# Patient Record
Sex: Male | Born: 1940 | Race: White | Hispanic: No | Marital: Married
Health system: Midwestern US, Academic
[De-identification: ages and names within clinical notes are randomized; demographics above are authoritative.]

---

## 2018-11-30 ENCOUNTER — Encounter: Admit: 2018-11-30 | Discharge: 2018-11-30

## 2018-12-01 ENCOUNTER — Encounter: Admit: 2018-12-01 | Discharge: 2018-12-01

## 2018-12-03 NOTE — Telephone Encounter
12/03/18 sent 2nd STAT request for records to [facility] edh

## 2018-12-04 ENCOUNTER — Encounter: Admit: 2018-12-04 | Discharge: 2018-12-04

## 2018-12-04 NOTE — Telephone Encounter
On 12/04/18 Medical records received records from [facility]. They can be accessed through the [facility] for the 12/10/18 appt with Dr Artis Delay.    *Consult and Holter Graph and Report are still pending as of 12/04/18, Kathe Mariner will send once available.*

## 2018-12-05 ENCOUNTER — Encounter: Admit: 2018-12-05 | Discharge: 2018-12-05

## 2018-12-10 ENCOUNTER — Encounter: Admit: 2018-12-10 | Discharge: 2018-12-10

## 2018-12-10 DIAGNOSIS — I10 Essential (primary) hypertension: Secondary | ICD-10-CM

## 2018-12-10 DIAGNOSIS — E7849 Other hyperlipidemia: Secondary | ICD-10-CM

## 2018-12-10 DIAGNOSIS — E785 Hyperlipidemia, unspecified: Secondary | ICD-10-CM

## 2018-12-10 DIAGNOSIS — I493 Ventricular premature depolarization: Principal | ICD-10-CM

## 2018-12-10 NOTE — Progress Notes
[facility]- Cardiovascular Medicine     CARDIOLOGY CLINIC NEW PATIENT ENCOUNTER    REFERRING PHYSICIAN:  Einar Pheasant, DO  [address]  [address]  [place], [place] [address]  Phone: [phone]    PCP:  Erskine Emery  [address]  [place] [address]  [phone]    PATIENT ID: Bobby Duarte is a 78 y.o. male    ENCOUNTER DATE: 12/10/2018    CHIEF COMPLAINT(S): New Patient (Referred by [facility]) and Cardiac Eval (Ventricular premature depolarization)         HISTORY OF THE PRESENT ILLNESS:    Bobby Duarte is a pleasant 78 y.o. male who is being seen in the Cardiology clinic today for the above chief complaint(s). His cardiac history is remarkable for HTN, HLD.  BP controlled.  He was evaluated recently in the hospital for not feeling himself and spacing out.  He had some PVCs on the monitor therefore he was kept for observation.  He underwent an exercise stress echocardiogram that was unremarkable.  He underwent a 24-hour Holter monitor that showed PVCs.    He denies: CP, SOB, orthopnea, DOE, LE swelling, Syncope, Palpitations, N/v/D.  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  No smoking         History reviewed. No pertinent past medical history.  History reviewed. No pertinent surgical history.  Family History   Problem Relation Age of Onset   ??? Cancer Mother    ??? Hypertension Father      Social History     Socioeconomic History   ??? Marital status: Unknown     Spouse name: Not on file   ??? Number of children: Not on file   ??? Years of education: Not on file   ??? Highest education level: Not on file   Occupational History   ??? Not on file   Tobacco Use   ??? Smoking status: Never Smoker   ??? Smokeless tobacco: Never Used   Substance and Sexual Activity   ??? Alcohol use: Yes     Comment: occ   ??? Drug use: Never   ??? Sexual activity: Not on file   Other Topics Concern   ??? Not on file   Social History Narrative   ??? Not on file Allergies:  No Known Allergies      Medications:    Current Outpatient Medications:   ???  allopurinoL (ZYLOPRIM) 100 mg tablet, Take 100 mg by mouth daily., Disp: , Rfl:   ???  hydroCHLOROthiazide (HYDRODIURIL) 25 mg tablet, Take 1 tablet by mouth daily., Disp: , Rfl:   ???  lisinopriL (ZESTRIL) 40 mg tablet, Take 1 tablet by mouth at bedtime daily., Disp: , Rfl:   ???  NIFEdipine XL (PROCARDIA-XL) 60 mg tablet, Take 1 tablet by mouth at bedtime daily., Disp: , Rfl:   ???  rosuvastatin (CRESTOR) 10 mg tablet, Take 1 tablet by mouth daily., Disp: , Rfl:     Review of Symptoms:  Constitutional: negative  Eyes: negative  Ears, nose, mouth, throat, and face: negative  Respiratory: negative  Cardiovascular: negative  Gastrointestinal: negative  Hematologic/lymphatic: negative  Musculoskeletal:negative  Neurological: negative  Behavioral/Psych: negative  Endocrine: negative  Allergic/Immunologic: negative    Physical Exam:  BP 132/78 (BP Source: Arm, Right Upper)  - Pulse 65  - Ht 1.829 m (6')  - Wt 96.9 kg (213 lb 9.6 oz)  -  BMI 28.97 kg/m???   General Appearance:    Alert, cooperative, no distress, appears stated age  Head:    Normocephalic, without obvious abnormality, atraumatic  Eyes:    PERRL, conjunctiva/corneas clear  Throat:   Lips, mucosa, and tongue normal  Neck:   no carotid bruit or JVD  Back:     Symmetric, no curvature  Lungs:     Clear to auscultation bilaterally, respirations unlabored  Chest Wall:    No tenderness or deformity   Heart:    Regular rate and rhythm, S1 and S2 normal, no murmur, rub or gallop  Abdomen:     Soft, non-tender, bowel sounds active all four quadrants, no masses, no organomegaly  Extremities:   No edema  Pulses:   2+ and symmetric all extremities  Skin:   Skin color, texture, turgor normal, no rashes or lesions  Neurologic:   Non focal        Lab/Radiology/Other Diagnostic Tests:  `Hemogram   Lab Results   Component Value Date/Time    WBC 8.5 11/30/2018    RBC 5.30 11/30/2018 HGB 16.0 11/30/2018    HCT 47.8 11/30/2018    MCV 90.2 11/30/2018    MCH 30.1 11/30/2018    MCHC 33.4 11/30/2018    RDW 12.7 11/30/2018    PLTCT 246 11/30/2018        Lab Level  Lab Results   Component Value Date    CHOL 151 04/02/2018     Lab Results   Component Value Date    LDL 86 04/02/2018     No results found for: LIPOPROTA  Lab Results   Component Value Date    HDL 54 04/02/2018     No results found for: [facility]  Lab Results   Component Value Date    TRIG 128 04/02/2018       Basic Chemistry    Lab Results   Component Value Date    NA 140 11/30/2018     Lab Results   Component Value Date    K 3.8 11/30/2018     Lab Results   Component Value Date    CO2 24.0 11/30/2018     Lab Results   Component Value Date    BUN 23.0 11/30/2018     Lab Results   Component Value Date    CR 1.25 11/30/2018     Lab Results   Component Value Date    CL 107 11/30/2018         Exercise stress echocardiogram: 12/08/2018 outside hospital.  Exercised for 6 minutes.  No evidence for ischemia with exercise.  PVCs at rest and with exercise.    Holter monitor 6.9% PVCs.       ASSESSMENT AND PLAN:  1.  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  -We will start low-dose beta-blocker if his symptoms do not improve with stopping his Crestor.  Repeat Holter monitor on metoprolol.   - He might require some neurologic evaluation.  His blood pressure appears to be stable.  He provides me with a log that shows his blood pressure between 130 systolic and 70s to 80s diastolic.  2.  Dyslipidemia.  We will hold off Crestor for now for a week to see if it improves at Crestor can result in mild cognitive impairment.  3.  Hypertension appears controlled.  Control reviewed current medications.    Lucianne Muss, MD  1:25 PM, 12/10/2018

## 2018-12-10 NOTE — Telephone Encounter
On 12/10/18 Medical records received Echo, Holter and Cardiology Consult from [facility]. They can be accessed through the [facility] for the 12/10/18 appt with Dr Artis Delay

## 2018-12-10 NOTE — Patient Instructions
STOP taking Crestor (rosuvastatin)     Please make your 3 month follow up appointment with Dr. Artis Delay before leaving the office today    In order to provide you the best care possible we ask that you follow up as below:    ? For all questions that may need to be addressed urgently, please call a member of the [facility] on Dr. Madaline Brilliant nursing line at [phone] Monday - Friday 8-5 only.         Please leave a detailed message with your name, date of birth, and reason for your call.  A nurse will return your call as soon as possible.     ? For NON-URGENT questions please contact us through your MyChart account.     ? For all medication refills please contact your pharmacy or send a request through [facility].     ? Please allow 10-15 business days for the results of any testing to be reviewed. Please call our office if you have not heard from a nurse within this time frame.    1. To schedule an appointment call [phone]  2. To Schedule a procedure/Test call [phone]  3. To schedule with EP call [phone]     It was truly our pleasure seeing you today. Thank you for choosing [facility] for your heart health!

## 2018-12-11 ENCOUNTER — Ambulatory Visit: Admit: 2018-12-10 | Discharge: 2018-12-11

## 2018-12-19 ENCOUNTER — Encounter: Admit: 2018-12-19 | Discharge: 2018-12-19

## 2018-12-19 DIAGNOSIS — I493 Ventricular premature depolarization: Secondary | ICD-10-CM

## 2018-12-19 MED ORDER — ROSUVASTATIN 10 MG PO TAB
10 mg | ORAL_TABLET | Freq: Every day | ORAL | 3 refills | 90.00000 days | Status: AC
Start: 2018-12-19 — End: ?

## 2018-12-19 MED ORDER — METOPROLOL SUCCINATE 25 MG PO TB24
12.5 mg | ORAL_TABLET | Freq: Every day | ORAL | 3 refills | 90.00000 days | Status: DC
Start: 2018-12-19 — End: 2019-02-27

## 2018-12-19 NOTE — Telephone Encounter
-----   Message from Donetta Potts, RN sent at 12/10/2018  1:57 PM CDT -----  Regarding: clinic follow up  On 12/09/17 at [place], pt stopped Crestor per [facility] d/t mental fogginess. Did stopping the Crestor clear up his thinking/mentation after being off of it for 1 week?    If so, pt is to stay off Crestor and let Dr. Artis Delay know.    If not, pt is to resume Crestor 10 mg daily AND add Toprol XL 12.5 mg daily at bedtime AND 48 hour holter monitor placed 1 month from the start of the Toprol xl (indication PVCs)

## 2018-12-19 NOTE — Telephone Encounter
Pt's wife returned my call and stated she has not see any improvements in his mentation since stopping the Crestor over a week ago. Wife instructed to have patient resume taking Crestor 10 mg daily and start Toprol XL 12.5 mg daily in the evening. Wife also informed that pt will need to schedule a 48 hour Holter monitor after being on the Toprol XL for about a month.

## 2019-02-02 ENCOUNTER — Encounter: Admit: 2019-02-02 | Discharge: 2019-02-02 | Payer: MEDICARE

## 2019-02-02 NOTE — Telephone Encounter
Spouse called stating pt was recently at [facility] for dizziness. Per spouse, pt was given Antivert and medication for vomiting. She said that as pt was hooked up to telemetry while there she was told his PVC's were no better than in July. She wanted to know if pt should increase his metoprolol. I told spouse we would request records from recent hospitalization. We will have Dr Artis Delay review records and evaluate pt at upcoming ov in a couple of weeks. Spouse agreeable to plan. Records requested from [facility] for upcoming appointment.

## 2019-02-11 ENCOUNTER — Encounter: Admit: 2019-02-11 | Discharge: 2019-02-11 | Payer: MEDICARE

## 2019-02-11 NOTE — Progress Notes
Holter Placement Record  Brand: Bio-Tel(CardioNet)  Ordering Physician: Artis Delay   Diagnosis: PVC'S  Length: 72  Serial Number: Holter enrollment   Start Time: home enrollment   Location where Holter was placed: Home Enrollment  Will Holter be returned by mail? Yes

## 2019-02-11 NOTE — Telephone Encounter
Called and spoke with Arlene.   Okay to send address is correct in chart

## 2019-02-27 ENCOUNTER — Ambulatory Visit: Admit: 2019-02-27 | Discharge: 2019-02-28 | Payer: MEDICARE

## 2019-02-27 ENCOUNTER — Encounter: Admit: 2019-02-27 | Discharge: 2019-02-27 | Payer: MEDICARE

## 2019-02-27 NOTE — Progress Notes
[facility]- Cardiovascular Medicine     CARDIOLOGY CLINIC NEW PATIENT ENCOUNTER    REFERRING PHYSICIAN:  Lucianne Muss, MD  [address]  [place], [place] [address]  Phone: [phone]    PCP:  Erskine Emery  [address]  [place] [address]  [phone]    PATIENT ID: Bobby Duarte is a 78 y.o. male    ENCOUNTER DATE: 02/27/2019    CHIEF COMPLAINT(S): Hypertension (review 72 hr monitor results) and PVC's         HISTORY OF THE PRESENT ILLNESS:    Bobby Duarte is a pleasant 78 y.o. male who is being seen in the Cardiology clinic today for the above chief complaint(s). His cardiac history is remarkable for HTN, HLD.  BP controlled.  He was evaluated July 2020 in the hospital for not feeling himself and spacing out.  He had some PVCs on the monitor therefore he was kept for observation.  He underwent an exercise stress echocardiogram that was unremarkable.  He underwent a 24-hour Holter monitor that showed PVCs.  He has been doing well but mid-September he had episodes of vertigo with nausea he went to the hospital where he had an EKG that showed his PVCs his blood pressure was controlled and his heart rate was controlled felt to be more like vertigo so he was started on meclizine his symptoms revolve resolved he did not like the meclizine afterwards and was stopped.  He has been doing well today.  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  No smoking         No past medical history on file.  No past surgical history on file.  Family History   Problem Relation Age of Onset   ? Cancer Mother    ? Hypertension Father      Social History     Socioeconomic History   ? Marital status: Married     Spouse name: Not on file   ? Number of children: Not on file   ? Years of education: Not on file ? Highest education level: Not on file   Occupational History   ? Not on file   Tobacco Use   ? Smoking status: Never Smoker   ? Smokeless tobacco: Never Used   Substance and Sexual Activity   ? Alcohol use: Yes     Comment: occ   ? Drug use: Never   ? Sexual activity: Not on file   Other Topics Concern   ? Not on file   Social History Narrative   ? Not on file        Allergies:  No Known Allergies      Medications:    Current Outpatient Medications:   ?  allopurinoL (ZYLOPRIM) 100 mg tablet, Take 100 mg by mouth daily., Disp: , Rfl:   ?  hydroCHLOROthiazide (HYDRODIURIL) 25 mg tablet, Take 1 tablet by mouth daily., Disp: , Rfl:   ?  lisinopriL (ZESTRIL) 40 mg tablet, Take 1 tablet by mouth at bedtime daily., Disp: , Rfl:   ?  metoprolol XL (TOPROL XL) 25 mg extended release tablet, Take one-half tablet by mouth daily., Disp: 45 tablet, Rfl: 3  ?  NIFEdipine XL (PROCARDIA-XL) 60 mg tablet, Take 1 tablet  by mouth at bedtime daily., Disp: , Rfl:   ?  rosuvastatin (CRESTOR) 10 mg tablet, Take one tablet by mouth daily., Disp: 90 tablet, Rfl: 3    Review of Symptoms:  Constitution: Negative.   HENT: Negative.   Eyes: Negative.   Cardiovascular: Negative.   Respiratory: Negative.   Endocrine: Negative.   Hematologic/Lymphatic: Negative.   Skin: Negative.   Gastrointestinal: Negative.   Genitourinary: Negative.   Neurological: Positive for loss of balance and vertigo.   Psychiatric/Behavioral: Negative.   Allergic/Immunologic: Negative.     Physical Exam:    BP 128/72 (BP Source: Arm, Right Upper)  - Pulse 69  - Ht 1.803 m (5' 11)  - Wt 97.4 kg (214 lb 12.8 oz)  - BMI 29.96 kg/m?     General Appearance:    Alert, cooperative, no distress, appears stated age  Head:    Normocephalic, without obvious abnormality, atraumatic  Eyes:    PERRL, conjunctiva/corneas clear  Throat:   Lips, mucosa, and tongue normal  Neck:   no carotid bruit or JVD  Back:     Symmetric, no curvature Lungs:     Clear to auscultation bilaterally, respirations unlabored  Chest Wall:    No tenderness or deformity   Heart:    Regular rate and rhythm, S1 and S2 normal, no murmur, rub or gallop  Abdomen:     Soft, non-tender, bowel sounds active all four quadrants, no masses, no organomegaly  Extremities:   No edema  Pulses:   2+ and symmetric all extremities  Skin:   Skin color, texture, turgor normal, no rashes or lesions  Neurologic:   Non focal        Lab/Radiology/Other Diagnostic Tests:  `Hemogram   Lab Results   Component Value Date/Time    WBC 8.5 11/30/2018    RBC 5.30 11/30/2018    HGB 16.0 11/30/2018    HCT 47.8 11/30/2018    MCV 90.2 11/30/2018    MCH 30.1 11/30/2018    MCHC 33.4 11/30/2018    RDW 12.7 11/30/2018    PLTCT 246 11/30/2018        Lab Level  Lab Results   Component Value Date    CHOL 151 04/02/2018     Lab Results   Component Value Date    LDL 86 04/02/2018     No results found for: LIPOPROTA  Lab Results   Component Value Date    HDL 54 04/02/2018     No results found for: [facility]  Lab Results   Component Value Date    TRIG 128 04/02/2018       Basic Chemistry    Lab Results   Component Value Date    NA 140 11/30/2018     Lab Results   Component Value Date    K 3.8 11/30/2018     Lab Results   Component Value Date    CO2 24.0 11/30/2018     Lab Results   Component Value Date    BUN 23.0 11/30/2018     Lab Results   Component Value Date    CR 1.25 11/30/2018     Lab Results   Component Value Date    CL 107 11/30/2018         Exercise stress echocardiogram: 12/08/2018 outside hospital.  Exercised for 6 minutes.  No evidence for ischemia with exercise.  PVCs at rest and with exercise.    Holter monitor 6.9% PVCs.  ASSESSMENT AND PLAN:  1.  Positional vertigo, I will refer him to ENT.  I will get carotid ultrasound.  It sounds to be very positional when he rotates in bed and raise his head up.  If negative ENT evaluation I think is reasonable to be evaluated by neurology.  His brainstem functional exam is unremarkable today.    2. PVCs: Appears to be asymptomatic.  Pending Holter result.  If above 10% might add low-dose flecainide.  He had a negative stress echo recently.  -We will cut down on his HCTZ as his last potassium was 3.5  -We will increase his Toprol to 25 mg daily.  3.  Dyslipidemia.  Continue Crestor.  4.  Hypertension appears controlled.  Control reviewed current medications.There will be adjusted with his PCP as needed    Lucianne Muss, MD  9:12 AM, 02/27/2019

## 2019-02-28 DIAGNOSIS — R42 Dizziness and giddiness: Secondary | ICD-10-CM

## 2019-02-28 DIAGNOSIS — I10 Essential (primary) hypertension: Principal | ICD-10-CM

## 2019-03-02 ENCOUNTER — Encounter: Admit: 2019-03-02 | Discharge: 2019-03-02 | Payer: MEDICARE

## 2019-03-05 ENCOUNTER — Encounter: Admit: 2019-03-05 | Discharge: 2019-03-05 | Payer: MEDICARE

## 2019-03-05 NOTE — Telephone Encounter
Attempted to call patient.  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 LM for pt to R/C to discuss symptoms as per rec from ABM.

## 2019-03-05 NOTE — Telephone Encounter
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  Continue current therapies.  We might need to add low-dose flecainide. we have recently increased his metoprolol to 25 mg daily, can you check on him please if he has not felt any difference since increasing the metoprolol he can go back to his normal dose 12.5 mg daily  (as his maximum heart rate on the current monitor was only 99 bpm).

## 2019-03-06 ENCOUNTER — Encounter: Admit: 2019-03-06 | Discharge: 2019-03-06 | Payer: MEDICARE

## 2019-03-06 NOTE — Telephone Encounter
Spoke to pt's spouse. He is feeling fine and not having any symptoms. Will go back to taking metoprolol 12.5 mg. 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  Continue current therapies.  We might need to add low-dose flecainide. we have recently increased his metoprolol to 25 mg daily, can you check on him please if he has not felt any difference since increasing the metoprolol he can go back to his normal dose 12.5 mg daily  (as his maximum heart rate on the current monitor was only 99 bpm).

## 2019-03-09 ENCOUNTER — Encounter: Admit: 2019-03-09 | Discharge: 2019-03-09 | Payer: MEDICARE

## 2019-03-09 NOTE — Telephone Encounter
Wife called to clarify that pt was supposed to be on Toprol XL 12.5 mg daily. I confirmed with pt's wife that she was correct on the dose of 12.5 mg daily, wife verbalized understanding of all information given.

## 2019-03-10 ENCOUNTER — Encounter: Admit: 2019-03-10 | Discharge: 2019-03-10 | Payer: MEDICARE

## 2019-03-10 MED ORDER — METOPROLOL SUCCINATE 25 MG PO TB24
25 mg | ORAL_TABLET | Freq: Every day | ORAL | 3 refills | 90.00000 days | Status: AC
Start: 2019-03-10 — End: ?

## 2019-03-10 MED ORDER — METOPROLOL SUCCINATE 25 MG PO TB24
12.5 mg | ORAL_TABLET | Freq: Every day | ORAL | 3 refills | 90.00000 days | Status: DC
Start: 2019-03-10 — End: 2019-03-10

## 2019-03-10 NOTE — Telephone Encounter
Pt was to go back to taking 12.5 mg of metoprolol if not feeling better. Initially sid he did not feel and different. Now thinks he does and wants to stay on 25 mg.

## 2019-08-27 ENCOUNTER — Encounter: Admit: 2019-08-27 | Discharge: 2019-08-27 | Payer: MEDICARE

## 2019-08-27 NOTE — Progress Notes
Trilby Leaver, 04-12-1941 has an appointment with Dr. Raynelle Bring on 09/02/19.    Please send recent lab results for continuity of care.    Thank you,   Bee Marchiano    Phone: [phone]  Fax: [phone]

## 2019-08-31 ENCOUNTER — Encounter: Admit: 2019-08-31 | Discharge: 2019-08-31 | Payer: MEDICARE

## 2019-09-02 ENCOUNTER — Ambulatory Visit: Admit: 2019-09-02 | Discharge: 2019-09-02 | Payer: MEDICARE

## 2019-09-02 ENCOUNTER — Encounter: Admit: 2019-09-02 | Discharge: 2019-09-02 | Payer: MEDICARE

## 2019-09-02 DIAGNOSIS — E785 Hyperlipidemia, unspecified: Secondary | ICD-10-CM

## 2019-09-02 DIAGNOSIS — I493 Ventricular premature depolarization: Secondary | ICD-10-CM

## 2019-09-02 DIAGNOSIS — I10 Essential (primary) hypertension: Secondary | ICD-10-CM

## 2019-09-02 DIAGNOSIS — R7303 Prediabetes: Secondary | ICD-10-CM

## 2019-10-16 ENCOUNTER — Encounter: Admit: 2019-10-16 | Discharge: 2019-10-16 | Payer: MEDICARE

## 2019-10-16 DIAGNOSIS — I493 Ventricular premature depolarization: Secondary | ICD-10-CM

## 2019-10-16 DIAGNOSIS — I10 Essential (primary) hypertension: Secondary | ICD-10-CM

## 2019-10-16 DIAGNOSIS — E785 Hyperlipidemia, unspecified: Secondary | ICD-10-CM

## 2019-10-21 ENCOUNTER — Encounter: Admit: 2019-10-21 | Discharge: 2019-10-21 | Payer: MEDICARE

## 2019-10-21 DIAGNOSIS — I10 Essential (primary) hypertension: Secondary | ICD-10-CM

## 2019-10-21 DIAGNOSIS — I493 Ventricular premature depolarization: Secondary | ICD-10-CM

## 2019-10-21 DIAGNOSIS — E785 Hyperlipidemia, unspecified: Secondary | ICD-10-CM

## 2019-10-25 ENCOUNTER — Encounter: Admit: 2019-10-25 | Discharge: 2019-10-25 | Payer: MEDICARE

## 2019-10-25 DIAGNOSIS — I493 Ventricular premature depolarization: Secondary | ICD-10-CM

## 2019-10-25 DIAGNOSIS — E785 Hyperlipidemia, unspecified: Secondary | ICD-10-CM

## 2019-10-25 DIAGNOSIS — I10 Essential (primary) hypertension: Secondary | ICD-10-CM

## 2020-01-15 ENCOUNTER — Encounter: Admit: 2020-01-15 | Discharge: 2020-01-15 | Payer: MEDICARE

## 2020-01-15 NOTE — Telephone Encounter
returned call  Bobby Duarte states she made the appt for the cardiac MRI at [facility] and plans to have his calcium score completed in [place]  she had no further questions at this time

## 2020-01-15 NOTE — Telephone Encounter
-----   Message from Golda Acre, LPN sent at 06/08/1476 10:38 AM CDT -----  Regarding: RAD- test ?  VM on triage line from wife [phone].  Said that RAD wants him to have CT scan done.  Can that be done at [facility]. [place] office or the [place] [facility]?

## 2020-02-05 ENCOUNTER — Ambulatory Visit: Admit: 2020-02-05 | Discharge: 2020-02-05 | Payer: MEDICARE

## 2020-02-05 ENCOUNTER — Encounter: Admit: 2020-02-05 | Discharge: 2020-02-05 | Payer: MEDICARE

## 2020-02-05 DIAGNOSIS — I493 Ventricular premature depolarization: Secondary | ICD-10-CM

## 2020-02-05 MED ORDER — GADOBENATE DIMEGLUMINE 529 MG/ML (0.1MMOL/0.2ML) IV SOLN
40 mL | Freq: Once | INTRAVENOUS | 0 refills | Status: CP
Start: 2020-02-05 — End: ?
  Administered 2020-02-05: 22:00:00 40 mL via INTRAVENOUS

## 2020-02-05 MED ORDER — SODIUM CHLORIDE 0.9 % IJ SOLN
20 mL | Freq: Once | INTRAVENOUS | 0 refills | Status: CP
Start: 2020-02-05 — End: ?
  Administered 2020-02-05: 22:00:00 20 mL via INTRAVENOUS

## 2020-02-09 ENCOUNTER — Encounter: Admit: 2020-02-09 | Discharge: 2020-02-09 | Payer: MEDICARE

## 2020-02-09 NOTE — Progress Notes
Request for the following medical records for purpose of continuity of care:   Has an appointment with RAD on 02/15/2020    Please send results of Calcium Score CT    Please Fax to:  [facility] - [phone]  Dr. Wallene Huh  Attention:  Valentina Gu, RN    Thank you

## 2020-02-09 NOTE — Telephone Encounter
Spoke with patient's wife regarding Dr. Doris Cheadle recommendations. Patient verbalized understanding and agreed with plan of care.

## 2020-02-09 NOTE — Telephone Encounter
-----   Message from Lucianne Muss, MD sent at 02/09/2020  2:37 PM CDT -----  Normal MRI with normal systolic function and no significant infiltrative or inflammatory process.

## 2020-02-11 ENCOUNTER — Encounter: Admit: 2020-02-11 | Discharge: 2020-02-11 | Payer: MEDICARE

## 2020-02-11 NOTE — Progress Notes
Records Request    Medical records request for continuation of care:    Patient has appointment on 02/15/2020   with  Dr. Larwance Sachs* .    Please fax records to Cardiovascular Medicine [place] [facility] [phone]    Request records: STAT          CT Calcium Score                 Thank you,      Cardiovascular Medicine  [facility]  [address]  [place], [place] [address]  Phone:  [phone]  Fax:  [phone]

## 2020-02-12 ENCOUNTER — Encounter: Admit: 2020-02-12 | Discharge: 2020-02-12 | Payer: MEDICARE

## 2020-02-12 NOTE — Progress Notes
Contacted patient's wife Arlene by phone regarding testing for CT Calcium Score ordered per Dr. Lucianne Muss in April 2021. Patient's wife stated that the nurse that had called and relayed the results of the Cardiac MRI stated that patient did not need anymore testing due to the MRI results being negative. Patient did not complete the CT Calcium Score for this reason. Patient plans to follow up with Dr. Wallene Huh in [facility] on 02/15/2020 for his scheduled follow up appointment.

## 2020-02-15 ENCOUNTER — Encounter: Admit: 2020-02-15 | Discharge: 2020-02-15 | Payer: MEDICARE

## 2020-02-15 DIAGNOSIS — E785 Hyperlipidemia, unspecified: Secondary | ICD-10-CM

## 2020-02-15 DIAGNOSIS — I493 Ventricular premature depolarization: Secondary | ICD-10-CM

## 2020-02-15 DIAGNOSIS — I10 Essential (primary) hypertension: Secondary | ICD-10-CM

## 2020-02-15 NOTE — Progress Notes
Date of Service: 02/15/2020    Bobby Duarte is a 79 y.o. male.       HPI     I had the pleasure of seeing Bobby Duarte in our office today for cardiac electrophysiology consultation regarding PVCs in response to request from his primary cardiologist, Dr. Raynelle Bring.   ?  As you recall, he is an extremely delightful 79 year old gentleman with no significant prior cardiovascular history, history of hypertension and hyperlipidemia, who presented to the hospital in July 2020 with symptoms of vertigo, lack of balance, dizziness, spinning sensation.  He had PVCs noted on the monitor and therefore he was admitted for observation.  He underwent extensive evaluation, including exercise stress echocardiogram that was normal.  A 24-hour Holter monitor was ordered, which showed 6.9% burden of monomorphic PVCs.  He was doing okay until mid September 2020 when he had another episode of vertigo and nausea, went to the hospital where he got an EKG that showed also PVCs.  He was started on meclizine for vertigo.  It did not improve and therefore he saw Dr. Raynelle Bring.  A repeat Holter monitor was ordered and he still had PVC burden around 7.2%, and Dr. Raynelle Bring ordered a cardiac MRI to rule out any structural heart disease, which has not been done yet.   ?  Patient denies any thyroid problems, sleep apnea, chest pain, palpitations, or shortness of breath.  His sleep is generally good, but it is on and off.  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     Dictation on: 02/15/2020  9:11 PM by: Wallene Huh, Kadasia Kassing [[email or web address]]              Vitals:    02/15/20 1534 02/15/20 1541   BP: 136/72 (!) 142/80   BP Source: Arm, Left Upper Arm, Right Upper   Patient Position: Sitting Sitting   Pulse: 86    SpO2: 98%    Weight: 97.2 kg (214 lb 3.2 oz)    Height: 1.803 m (5' 11)    PainSc: Zero      Body mass index is 29.87 kg/m?Marland Kitchen     Past Medical History  Patient Active Problem List    Diagnosis Date Noted   ? 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 12/01/2018 - Exercise Stress Echo - 1. No exercise induced chest pain  2. no ECG evidence of ischemia  3. No echocardiographic evidence of ischemia  4. Occasional PVCs with exercise  5. Low risk scan   b. 03/02/2019 - Holter Monitor - 1. 73 hours --mean HR 59 Bpm / Max HR 99 bpm / Minimum HR 42 bpm  - 7.2% ventricular prematurities asympto,atoc / SVT prematurities 612 the longest  SVT run 11 beats / no triggered events  --> increased Toprol to 25 mg QD   C.  02/05/2020 - Cardiac MRI:  No evidence of delayed myocardial enhancement or T1/T2 mapping abnormality to suggest scar, myocarditis or infiltrating myocardial disease.  ?Normal size left ventricle with normal systolic function and ejection fraction estimated at 78%.      ? Hypertension    ? Hyperlipidemia          Review of Systems   Constitutional: Negative.   HENT: Negative.    Eyes: Negative.    Cardiovascular: Negative.    Respiratory: Negative.    Endocrine: Negative.    Hematologic/Lymphatic: Negative.    Skin: Negative.    Musculoskeletal: Negative.  Gastrointestinal: Negative.    Genitourinary: Negative.    Neurological: Negative.    Psychiatric/Behavioral: Negative.    Allergic/Immunologic: Negative.        Physical Exam  Patient is a moderately well-built gentleman who is comfortable at rest,  not in any distress.  Sclerae anicteric.  The oral mucosa is moist and pink.  Neck is  supple without any lymphadenopathy.  Lungs are clear to auscultation bilaterally.  Breath  sounds are normal.  Cardiac exam reveals normal S1, S2 with regular rate and  rhythm.  No murmurs, rubs or gallops noted.  Abdomen:  Soft, nontender, nondistended.  Bowel sounds are present.  Extremities:  No cyanosis, clubbing or edema.  Peripheral  pulses are symmetric.  Skin without any rash. . No gross motor or neuro deficits.      Cardiovascular Studies      Cardiovascular Health Factors  Vitals BP Readings from Last 3 Encounters: 02/15/20 (!) 142/80   10/21/19 (!) 150/84   09/02/19 132/72     Wt Readings from Last 3 Encounters:   02/15/20 97.2 kg (214 lb 3.2 oz)   10/21/19 99 kg (218 lb 3.2 oz)   02/05/20 98.9 kg (218 lb)     BMI Readings from Last 3 Encounters:   02/15/20 29.87 kg/m?   10/21/19 30.43 kg/m?   02/05/20 30.40 kg/m?      Smoking Social History     Tobacco Use   Smoking Status Never Smoker   Smokeless Tobacco Never Used      Lipid Profile Cholesterol   Date Value Ref Range Status   05/28/2019 157  Final     HDL   Date Value Ref Range Status   05/28/2019 46  Final     LDL   Date Value Ref Range Status   05/28/2019 96  Final     Triglycerides   Date Value Ref Range Status   05/28/2019 74  Final      Blood Sugar No results found for: HGBA1C  Glucose   Date Value Ref Range Status   05/28/2019 96  Final   11/30/2018 96  Final          Problems Addressed Today  No diagnosis found.    Assessment and Plan     As above in HPI section.           Current Medications (including today's revisions)  ? allopurinoL (ZYLOPRIM) 100 mg tablet Take 100 mg by mouth daily.   ? fexofenadine (ALLEGRA) 180 mg tablet Take 180 mg by mouth as Needed.   ? hydroCHLOROthiazide (HYDRODIURIL) 12.5 mg tablet Take 12.5 mg by mouth every morning.   ? lisinopriL (ZESTRIL) 40 mg tablet Take 1 tablet by mouth at bedtime daily.   ? metoprolol XL (TOPROL XL) 25 mg extended release tablet Take one tablet by mouth daily.   ? NIFEdipine XL (PROCARDIA-XL) 60 mg tablet Take 1 tablet by mouth at bedtime daily.   ? rosuvastatin (CRESTOR) 10 mg tablet Take one tablet by mouth daily.

## 2020-03-02 ENCOUNTER — Encounter: Admit: 2020-03-02 | Discharge: 2020-03-02 | Payer: MEDICARE

## 2020-03-02 MED ORDER — METOPROLOL SUCCINATE 25 MG PO TB24
ORAL_TABLET | Freq: Every day | ORAL | 3 refills | 90.00000 days | Status: AC
Start: 2020-03-02 — End: ?

## 2020-03-03 ENCOUNTER — Encounter: Admit: 2020-03-03 | Discharge: 2020-03-03 | Payer: MEDICARE

## 2020-03-03 NOTE — Progress Notes
Labs received from PCP today were already entered in historical.

## 2020-03-03 NOTE — Progress Notes
Trilby Leaver, 1941-03-25 has an appointment with Dr. Raynelle Bring on 03/08/2020.    Please send recent lab results for continuity of care.    Thank you,   Estalene Bergey    Phone: [phone]  Fax: [phone]

## 2020-03-08 ENCOUNTER — Ambulatory Visit: Admit: 2020-03-08 | Discharge: 2020-03-08 | Payer: MEDICARE

## 2020-03-08 ENCOUNTER — Encounter: Admit: 2020-03-08 | Discharge: 2020-03-08 | Payer: MEDICARE

## 2020-03-08 DIAGNOSIS — I10 Essential (primary) hypertension: Secondary | ICD-10-CM

## 2020-03-08 DIAGNOSIS — E785 Hyperlipidemia, unspecified: Secondary | ICD-10-CM

## 2020-03-08 DIAGNOSIS — M7989 Other specified soft tissue disorders: Secondary | ICD-10-CM

## 2020-03-08 DIAGNOSIS — I493 Ventricular premature depolarization: Secondary | ICD-10-CM

## 2020-03-08 DIAGNOSIS — R7303 Prediabetes: Secondary | ICD-10-CM

## 2020-03-08 NOTE — Patient Instructions
We will place your 48 hour Holter monitor before leaving the office today. Please return it as instructed after wearing it for 48 hours. We will call you with results in apx 10-14 days after you return it.    Please call [phone] to  schedule your lower extremity duplex . We will call you with results in apx 7-10 days after you have it completed.    Dr. Raynelle Bring would like for you to have fasting labs drawn within one week. If you have your labs drawn at a [facility] facility, you do not need to take orders with you. If you would like to have your labs drawn at an outside facility, you will need hard copies of your lab orders or we can fax them to the lab for you.     FOLLOW UP APPOINTMENT: PLEASE SCHEDULE BEFORE LEAVING TODAY    Please call scheduling at [phone]  to schedule your 6 month follow up appointment with Dr. Raynelle Bring.    *Please arrive 15 minutes before appointment time to allow our team to perform intake and room you appropriately*      In order to provide you the best care possible we ask that you follow up as below:    ? For questions, please call a team member on Dr. Doris Cheadle nursing line at [phone] Monday - Friday 8-5 only.         Please leave a detailed message with your name, date of birth, and reason for your call.  A nurse will return your call as soon as possible.     ? You may also send [place] questions through your MyChart account.     ? For all medication refills please contact your pharmacy or send a request through MyChart.     ? Lab and test results:  As a part of the CARES act, starting 08/20/2019, some results will be released to you via mychart immediately and automatically.  You may see results before your provider sees them; however, your provider will review all these results and then they, or one of their team, will notify you of result information and recommendations.   Critical results will be addressed immediately, but otherwise, please allow [place] time to get back with you prior to you reaching out to [place] for questions.  This will usually take about 72 hours for labs and 5-7 days for procedure test results.        1. To schedule an appointment call [phone]  2. To Schedule a procedure/Test call [phone]  3. To schedule with EP call [phone]     It was truly our pleasure seeing you today. Thank you for choosing [facility] for your heart health!    Instructions given by Chari Manning, RN

## 2020-03-08 NOTE — Progress Notes
[facility]- Cardiovascular Medicine     CARDIOLOGY CLINIC NEW PATIENT ENCOUNTER    REFERRING PHYSICIAN:  Lucianne Muss, MD  [address]  [place],  [place] [address]  Phone: [phone]    PCP:  Erskine Emery  [address]  [place] [address]  [phone]    PATIENT ID: Bobby Duarte is a 79 y.o. male    ENCOUNTER DATE: 03/08/2020    CHIEF COMPLAINT(S):    HISTORY OF THE PRESENT ILLNESS:    Bobby Duarte is a pleasant 79 y.o. male who is being seen in the Cardiology clinic today for the above chief complaint(s). His cardiac history is remarkable for HTN, HLD.  BP controlled.  He was evaluated July 2020 in the hospital for not feeling himself and spacing out.  He had some PVCs on the monitor therefore he was kept for observation.  He underwent an exercise stress echocardiogram that was unremarkable.  He underwent a 24-hour Holter monitor that showed PVCs.  He has been doing well but mid-September 2020 he had episodes of vertigo with nausea he went to the hospital where he had an EKG that showed his PVCs his blood pressure was controlled and his heart rate was controlled felt to be more like vertigo so he was started on meclizine his symptoms revolve resolved he did not like the meclizine afterwards and was stopped.    He has been doing really well, he has been very active harvesting, no chest pain tightness or shortness of breath, no new symptoms of orthopnea.  He has stable lower extremity swelling that goes up and down.    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  No smoking         Medical History:   Diagnosis Date   ? Dyslipidemia    ? Hypertension    ? PVC's (premature ventricular contractions)      No past surgical history on file.  Family History   Problem Relation Age of Onset   ? Cancer Mother    ? Hypertension Father      Social History     Socioeconomic History   ? Marital status: Married     Spouse name: Not on file   ? Number of children: Not on file   ? Years of education: Not on file   ? Highest education level: Not on file   Occupational History   ? Occupation: farmer   Tobacco Use   ? Smoking status: Never Smoker   ? Smokeless tobacco: Never Used   Substance and Sexual Activity   ? Alcohol use: Yes     Comment: occ   ? Drug use: Never   ? Sexual activity: Not on file   Other Topics Concern   ? Not on file   Social History Narrative   ? Not on file        Allergies:  No Known Allergies      Medications:    Current Outpatient Medications:   ?  allopurinoL (ZYLOPRIM) 100 mg tablet, Take 100 mg by mouth daily., Disp: , Rfl:   ?  fexofenadine (ALLEGRA) 180 mg tablet, Take 180 mg by mouth as Needed., Disp: , Rfl:   ?  hydroCHLOROthiazide (HYDRODIURIL) 12.5 mg tablet, Take 12.5 mg by mouth every morning., Disp: , Rfl:   ?  lisinopriL (ZESTRIL) 40 mg tablet, Take 1 tablet  by mouth at bedtime daily., Disp: , Rfl:   ?  metoprolol XL (TOPROL XL) 25 mg extended release tablet, Take 1 tablet by mouth once daily, Disp: 90 tablet, Rfl: 3  ?  NIFEdipine XL (PROCARDIA-XL) 60 mg tablet, Take 1 tablet by mouth at bedtime daily., Disp: , Rfl:   ?  rosuvastatin (CRESTOR) 10 mg tablet, Take one tablet by mouth daily., Disp: 90 tablet, Rfl: 3    Review of Symptoms:  Constitution: Negative.   HENT: Negative.    Eyes: Negative.    Cardiovascular: Negative.    Respiratory: Negative.    Endocrine: Negative.    Hematologic/Lymphatic: Negative.    Skin: Negative.    Gastrointestinal: Negative.    Genitourinary: Negative.    Neurological: Negative.    Psychiatric/Behavioral: Negative.    Allergic/Immunologic: Negative.      Physical Exam:  BP 128/66 (BP Source: Arm, Left Upper, Patient Position: Sitting)  - Pulse 66  - Ht 1.803 m (5' 11)  - Wt 98.9 kg (218 lb 1.6 oz)  - BMI 30.42 kg/m?     General Appearance:    Alert, cooperative, no distress, appears stated age  Head:    Normocephalic, without obvious abnormality, atraumatic  Eyes:    PERRL, conjunctiva/corneas clear  Throat:   Lips, mucosa, and tongue normal  Neck:   no carotid bruit or JVD  Back:     Symmetric, no curvature  Lungs:     Clear to auscultation bilaterally, respirations unlabored  Chest Wall:    No tenderness or deformity   Heart:    Regular rate and rhythm, S1 and S2 normal, no murmur, rub or gallop  Abdomen:     Soft, non-tender, bowel sounds active all four quadrants, no masses, no organomegaly  Extremities:  Mild bilateral edema stable per patient  Pulses:   2+ and symmetric all extremities  Skin:   Skin color, texture, turgor normal, no rashes or lesions  Neurologic:   Non focal        Lab/Radiology/Other Diagnostic Tests:  `Hemogram   Lab Results   Component Value Date/Time    WBC 9.2 01/22/2019 12:00 AM    RBC 5.58 01/22/2019 12:00 AM    HGB 16.6 01/22/2019 12:00 AM    HCT 30.9 01/22/2019 12:00 AM    MCV 91.2 01/22/2019 12:00 AM    MCH 29.8 01/22/2019 12:00 AM    MCHC 32.7 01/22/2019 12:00 AM    RDW 13 01/22/2019 12:00 AM    PLTCT 269 01/22/2019 12:00 AM        Lab Level  Lab Results   Component Value Date    CHOL 157 05/28/2019     Lab Results   Component Value Date    LDL 96 05/28/2019     No results found for: LIPOPROTA  Lab Results   Component Value Date    HDL 46 05/28/2019     No results found for: [facility]  Lab Results   Component Value Date    TRIG 74 05/28/2019       Basic Chemistry    Lab Results   Component Value Date    NA 138 05/28/2019    NA 140 11/30/2018     Lab Results   Component Value Date    K 3.6 05/28/2019    K 3.8 11/30/2018     Lab Results   Component Value Date    CO2 23 05/28/2019    CO2 24.0 11/30/2018  Lab Results   Component Value Date    BUN 21 05/28/2019    BUN 23.0 11/30/2018     Lab Results   Component Value Date    CR 1.07 05/28/2019    CR 1.25 11/30/2018     Lab Results   Component Value Date    CL 103 05/28/2019    CL 107 11/30/2018         Exercise stress echocardiogram: 12/08/2018 outside hospital.  Exercised for 6 minutes.  No evidence for ischemia with exercise.  PVCs at rest and with exercise.    Holter monitor 03/02/19  ? Indication: Ventricular premature depolarization  ? Analysis time: 73 hr  ? Mean HR: 59 bpm. Maximum HR: 99 bpm. Minimum HR: 42 bpm, 6 AM possibly when asleep, asymptomatic  ? Ventricular prematurities - total: 16109, 7.2% asymptomatic.  ? Supraventricular prematurities - total: 612, the longest SVT run was 11 beats.  ? No triggered events noted. Some diary entries are not represented on report due to occurring before or after readable rhythm.     CMR:  1. ?No evidence of delayed myocardial enhancement or T1/T2 mapping   abnormality to suggest scar, myocarditis or infiltrating myocardial   disease.     2. ?Normal size left ventricle with normal systolic function and ejection   fraction estimated at 78%.       ASSESSMENT AND PLAN:  1. PVCs: 7 (2020) to 14% (2021).   - He had a negative stress echo and normal MRI. He was evaluated by Dr. Wallene Huh.  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 Toprol.  Check Holter monitor to assess burden.    2.  Dyslipidemia.  Continue Crestor 10 mg daily.  LDL 96 increased compared to prior, I offered to increase his Crestor 20 mg daily.  However he is going to change his diet lose weight and will repeat lipids.  Calcium score also be helpful he is agreeable    3.  Hypertension appears controlled.  He has been on hydrochlorothiazide (lowered to 12.5 mg for hypokalemia) , lisinopril, metoprolol, nifedipine (with mild lower extremity swelling)    4.  Stable lower extremity swelling: Cardiac MRI with normal heart function.  Will check BNP and lower extremity Doppler to exclude thrombus.  Lucianne Muss, MD  9:16 AM, 03/08/2020

## 2020-03-08 NOTE — Progress Notes
Online enrollment complete

## 2020-03-08 NOTE — Progress Notes
Holter Placement Record  Ordering Physician: Morad  Diagnosis: Premature Ventricular Contractions  Brand: Biotel  Length: 48 hours  Holter Number: [id number]  Card Number: N/A  Holter start: 03/08/20  Location where Holter was placed: [place]  Will Holter be returned by mail? Yes

## 2020-03-14 ENCOUNTER — Encounter: Admit: 2020-03-14 | Discharge: 2020-03-14 | Payer: MEDICARE

## 2020-03-14 NOTE — Telephone Encounter
Pt's wife called saying she forgot to place a couple sticky notes back with the holter monitor. She stated the monitor and diary are included. Advised patient to mail back as she has it but keep those sticky notes just in case. Pt's wife verbalized understanding and stated she will mail it in today.

## 2020-03-23 ENCOUNTER — Encounter: Admit: 2020-03-23 | Discharge: 2020-03-23 | Payer: MEDICARE

## 2020-07-21 ENCOUNTER — Encounter: Admit: 2020-07-21 | Discharge: 2020-07-21 | Payer: MEDICARE

## 2020-07-21 NOTE — Telephone Encounter
Pt's wife called asking if lower extremity scan could now be scheduled for patient that was ordered in October 2021. Explained that we can schedule it now and transferred patient's wife to scheduling. Pt's wife verbalized understanding and agrees with plan of care.

## 2020-09-12 ENCOUNTER — Encounter: Admit: 2020-09-12 | Discharge: 2020-09-12 | Payer: MEDICARE

## 2020-09-12 NOTE — Progress Notes
Bobby Duarte, 03/14/1941 has an appointment with Dr. Raynelle Bring on 09/16/20.    Please send recent lab results for continuity of care.    Thank you,   Shawnae Leiva    Phone: [phone]  Fax: [phone]

## 2020-09-15 ENCOUNTER — Encounter: Admit: 2020-09-15 | Discharge: 2020-09-15 | Payer: MEDICARE

## 2020-09-16 ENCOUNTER — Ambulatory Visit: Admit: 2020-09-16 | Discharge: 2020-09-17 | Payer: MEDICARE

## 2020-09-16 ENCOUNTER — Encounter: Admit: 2020-09-16 | Discharge: 2020-09-16 | Payer: MEDICARE

## 2020-09-16 DIAGNOSIS — I493 Ventricular premature depolarization: Secondary | ICD-10-CM

## 2020-09-16 DIAGNOSIS — I10 Essential (primary) hypertension: Secondary | ICD-10-CM

## 2020-09-16 DIAGNOSIS — E785 Hyperlipidemia, unspecified: Secondary | ICD-10-CM

## 2020-09-16 DIAGNOSIS — R7303 Prediabetes: Secondary | ICD-10-CM

## 2020-09-16 MED ORDER — POTASSIUM CHLORIDE 20 MEQ PO TBTQ
20 meq | ORAL_TABLET | Freq: Every day | ORAL | 3 refills | 30.00000 days | Status: AC
Start: 2020-09-16 — End: ?

## 2020-09-16 NOTE — Progress Notes
[facility]- Cardiovascular Medicine     CARDIOLOGY CLINIC NEW PATIENT ENCOUNTER    REFERRING PHYSICIAN:  Lucianne Muss, MD  [address]  [place],  [place] [address]  Phone: [phone]    PCP:  Erskine Emery  [address]  [place] [address]  [phone]    PATIENT ID: Bobby Duarte is a 80 y.o. male    ENCOUNTER DATE: 09/16/2020    CHIEF COMPLAINT(S):    HISTORY OF THE PRESENT ILLNESS:    Bobby Duarte is a pleasant 80 y.o. male who is being seen in the Cardiology clinic today for the above chief complaint(s). His cardiac history is remarkable for HTN, HLD.  BP controlled.    He was evaluated July 2020 in the hospital for not feeling himself and spacing out.  He had some PVCs on the monitor therefore he was kept for observation.  He underwent an exercise stress echocardiogram that was unremarkable.  He underwent a 24-hour Holter monitor that showed PVCs.  He has been doing well but mid-September 2020 he had episodes of vertigo with nausea he went to the hospital where he had an EKG that showed his PVCs his blood pressure was controlled and his heart rate was controlled felt to be more like vertigo so he was started on meclizine his symptoms revolve resolved he did not like the meclizine afterwards and was stopped.      He has been doing really well, working outside actively without chest pain tightness or shortness of breath. He has stable lower extremity swelling that goes up and down.  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  No smoking         Medical History:   Diagnosis Date   ? Dyslipidemia    ? Hypertension    ? PVC's (premature ventricular contractions)      No past surgical history on file.  Family History   Problem Relation Age of Onset   ? Cancer Mother    ? Hypertension Father      Social History     Socioeconomic History   ? Marital status: Married     Spouse name: Not on file   ? Number of children: Not on file   ? Years of education: Not on file   ? Highest education level: Not on file   Occupational History   ? Occupation: farmer   Tobacco Use   ? Smoking status: Never Smoker   ? Smokeless tobacco: Never Used   Substance and Sexual Activity   ? Alcohol use: Yes     Comment: occ   ? Drug use: Never   ? Sexual activity: Not on file   Other Topics Concern   ? Not on file   Social History Narrative   ? Not on file        Allergies:  No Known Allergies      Medications:    Current Outpatient Medications:   ?  allopurinoL (ZYLOPRIM) 100 mg tablet, Take 100 mg by mouth daily., Disp: , Rfl:   ?  fexofenadine (ALLEGRA) 180 mg tablet, Take 180 mg by mouth as Needed., Disp: , Rfl:   ?  hydroCHLOROthiazide (HYDRODIURIL) 12.5 mg tablet, Take 12.5 mg by mouth every morning., Disp: , Rfl:   ?  lisinopriL (ZESTRIL) 40 mg tablet, Take 1 tablet by mouth at bedtime daily., Disp: ,  Rfl:   ?  metoprolol XL (TOPROL XL) 25 mg extended release tablet, Take 1 tablet by mouth once daily, Disp: 90 tablet, Rfl: 3  ?  NIFEdipine XL (PROCARDIA-XL) 60 mg tablet, Take 1 tablet by mouth at bedtime daily., Disp: , Rfl:   ?  rosuvastatin (CRESTOR) 10 mg tablet, Take one tablet by mouth daily., Disp: 90 tablet, Rfl: 3    Review of Symptoms:  Constitution: Negative.   HENT: Negative.    Eyes: Negative.    Cardiovascular: Negative.    Respiratory: Negative.    Endocrine: Negative.    Hematologic/Lymphatic: Negative.    Skin: Negative.    Gastrointestinal: Negative.    Genitourinary: Negative.    Neurological: Negative.    Psychiatric/Behavioral: Negative.    Allergic/Immunologic: Negative.      Physical Exam:  BP 122/72 (BP Source: Arm, Left Upper, Patient Position: Sitting)  - Pulse 74  - Ht 180.3 cm (5' 11)  - Wt 99.4 kg (219 lb 3.2 oz)  - SpO2 97%  - BMI 30.57 kg/m?     General Appearance:    Alert, cooperative, no distress, appears stated age  Head:    Normocephalic, without obvious abnormality, atraumatic  Eyes:    PERRL, conjunctiva/corneas clear  Throat:   Lips, mucosa, and tongue normal  Neck:   no carotid bruit or JVD  Back:     Symmetric, no curvature  Lungs:     Clear to auscultation bilaterally, respirations unlabored  Chest Wall:    No tenderness or deformity   Heart:    Regular rate and rhythm, S1 and S2 normal, no murmur, rub or gallop  Abdomen:     Soft, non-tender, bowel sounds active all four quadrants, no masses, no organomegaly  Extremities:  Mild bilateral edema stable per patient  Pulses:   2+ and symmetric all extremities  Skin:   Skin color, texture, turgor normal, no rashes or lesions  Neurologic:   Non focal        Lab/Radiology/Other Diagnostic Tests:  `Hemogram   Lab Results   Component Value Date/Time    WBC 9.2 01/22/2019 12:00 AM    RBC 5.58 01/22/2019 12:00 AM    HGB 16.6 01/22/2019 12:00 AM    HCT 30.9 01/22/2019 12:00 AM    MCV 91.2 01/22/2019 12:00 AM    MCH 29.8 01/22/2019 12:00 AM    MCHC 32.7 01/22/2019 12:00 AM    RDW 13 01/22/2019 12:00 AM    PLTCT 269 01/22/2019 12:00 AM        Lab Level  Lab Results   Component Value Date    CHOL 155 06/15/2020     Lab Results   Component Value Date    LDL 96 06/15/2020     No results found for: LIPOPROTA  Lab Results   Component Value Date    HDL 46 06/15/2020     No results found for: [facility]  Lab Results   Component Value Date    TRIG 63 06/15/2020       Basic Chemistry    Lab Results   Component Value Date    NA 137 06/15/2020    NA 138 05/28/2019    NA 140 11/30/2018     Lab Results   Component Value Date    K 3.5 06/15/2020    K 3.6 05/28/2019    K 3.8 11/30/2018     Lab Results   Component Value Date    CO2 24 06/15/2020  CO2 23 05/28/2019    CO2 24.0 11/30/2018     Lab Results   Component Value Date    BUN 16.0 06/15/2020    BUN 21 05/28/2019    BUN 23.0 11/30/2018     Lab Results   Component Value Date    CR 1.03 06/15/2020    CR 1.07 05/28/2019    CR 1.25 11/30/2018     Lab Results   Component Value Date    CL 101 06/15/2020    CL 103 05/28/2019    CL 107 11/30/2018 Exercise stress echocardiogram: 12/08/2018 outside hospital.  Exercised for 6 minutes.  No evidence for ischemia with exercise.  PVCs at rest and with exercise.    Holter monitor 03/02/19  ? Indication: Ventricular premature depolarization  ? Analysis time: 73 hr  ? Mean HR: 59 bpm. Maximum HR: 99 bpm. Minimum HR: 42 bpm, 6 AM possibly when asleep, asymptomatic  ? Ventricular prematurities - total: 09811, 7.2% asymptomatic.  ? Supraventricular prematurities - total: 612, the longest SVT run was 11 beats.  ? No triggered events noted. Some diary entries are not represented on report due to occurring before or after readable rhythm.     CMR:  1. ?No evidence of delayed myocardial enhancement or T1/T2 mapping abnormality to suggest scar, myocarditis or infiltrating myocardial disease.     2. ?Normal size left ventricle with normal systolic function and ejection fraction estimated at 78%.       ASSESSMENT AND PLAN:  1. PVCs: 7 (2020) to 14% (2021).  02/2020 0.03%  - He had a negative stress echo and normal MRI. He was evaluated by Dr. Wallene Huh.  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 Toprol/Nifedipine.  Start potassium 20 meq daily.  -Echocardiogram.    2.  Dyslipidemia.  - Continue Crestor 10 mg daily.  LDL 96 increased, I offered to increase his Crestor 20 mg daily.  He will start taking his Crestor 10 mg daily.  - Proceed with calcium scoring.    3.  Hypertension appears controlled.   - He has been on hydrochlorothiazide 12.5mg  add potasium 20 daily.  - lisinopril, metoprolol, nifedipine (with mild lower extremity swelling)    4.  Stable lower extremity swelling: Cardiac MRI with normal heart function.     Lucianne Muss, MD  9:42 AM, 09/16/2020

## 2020-09-16 NOTE — Patient Instructions
START taking Potassium 20 mEq daily     CALCIUM SCORE: PLEASE CALL TO SCHEDULE    We have ordered a Calcium Score test. This can be scheduled at two of our imaging centers. Insurance does not cover the cost of this screening. The cost of this test is $45.     [facility]  [address]  [place], [place] [address]  [phone]    [facility]   [address]  [place], [place] [address]  [phone]      FOLLOW UP APPOINTMENT:     Dr. Raynelle Bring would like to see you back in 6 months with an Echocardiogram at that time. Please call [phone] to cancel or reschedule your visit.     *Please arrive 15 minutes before appointment time to allow our team to perform intake and room you appropriately*    Dr. Raynelle Bring would like for you to have fasting labs drawn in 6 months. If you have your labs drawn at a [facility] facility, you do not need to take orders with you. If you would like to have your labs drawn at an outside facility, you will need hard copies of your lab orders or we can fax them to the lab for you.     In order to provide you the best care possible we ask that you follow up as below:     For questions: please Dr. Doris Cheadle nursing line at [phone] Monday - Friday 8-5 only.         Please leave a detailed message with your name, date of birth, and reason for your call.  A nurse will return your call as soon as possible.      For all medication refills: please contact your pharmacy.       Scheduling: Please call [phone]    It was truly our pleasure seeing you today. Thank you for choosing [facility] for your heart health!    Instructions given by Chari Manning, RN

## 2020-09-17 DIAGNOSIS — E785 Hyperlipidemia, unspecified: Secondary | ICD-10-CM

## 2021-02-20 ENCOUNTER — Encounter: Admit: 2021-02-20 | Discharge: 2021-02-20 | Payer: MEDICARE

## 2021-02-20 MED ORDER — METOPROLOL SUCCINATE 25 MG PO TB24
ORAL_TABLET | Freq: Every day | ORAL | 3 refills | 90.00000 days | Status: AC
Start: 2021-02-20 — End: ?

## 2021-03-08 ENCOUNTER — Encounter: Admit: 2021-03-08 | Discharge: 2021-03-08 | Payer: MEDICARE

## 2021-03-08 NOTE — Progress Notes
Bobby Duarte, February 10, 1941 has an appointment with Dr. Raynelle Bring on 03/14/21.    Please send recent lab results for continuity of care.    Thank you,   Francie Keeling    Phone: [phone]  Fax: [phone]

## 2021-03-14 ENCOUNTER — Ambulatory Visit: Admit: 2021-03-14 | Discharge: 2021-03-14 | Payer: MEDICARE

## 2021-03-14 ENCOUNTER — Encounter: Admit: 2021-03-14 | Discharge: 2021-03-14 | Payer: MEDICARE

## 2021-03-14 DIAGNOSIS — I10 Essential (primary) hypertension: Secondary | ICD-10-CM

## 2021-03-14 DIAGNOSIS — E785 Hyperlipidemia, unspecified: Secondary | ICD-10-CM

## 2021-03-14 DIAGNOSIS — I493 Ventricular premature depolarization: Secondary | ICD-10-CM

## 2021-03-14 MED ORDER — PERFLUTREN LIPID MICROSPHERES 1.1 MG/ML IV SUSP
1-10 mL | Freq: Once | INTRAVENOUS | 0 refills | Status: CP | PRN
Start: 2021-03-14 — End: ?
  Administered 2021-03-14: 15:00:00 2.5 mL via INTRAVENOUS

## 2021-03-14 NOTE — Patient Instructions
CALCIUM SCORE: PLEASE CALL TO SCHEDULE - [phone]    We have ordered a Calcium Score test. This is a test to determine if there is any plaque in the arteries of your heart. This can be scheduled at two of our imaging centers. Insurance does not cover the cost of this screening. The cost of this test is $45.     Dr. Raynelle Bring  would like for you to have fasting labs drawn soon. If you have your labs drawn at a [facility] facility, you do not need to take orders with you. If you would like to have your labs drawn at an outside facility, you will need hard copies of your lab orders or we can fax them to the lab for you.     In order to provide you the best care possible we ask that you follow up as below:    For questions: please call Dr. Doris Cheadle nursing line at [phone] Monday - Friday 8-5 only.         Please leave a detailed message with your name, date of birth, and reason for your call.  A nurse will return your call as soon as possible.     For all medication refills: please contact your pharmacy.      Scheduling: Please call [phone]    It was truly our pleasure seeing you today. Thank you for choosing [facility] for your heart health!    Instructions given by Chari Manning, RN

## 2021-03-14 NOTE — Progress Notes
[facility]- Cardiovascular Medicine     CARDIOLOGY CLINIC NEW PATIENT ENCOUNTER    REFERRING PHYSICIAN:  Lucianne Muss, MD  [address]  [place],  [place] [address]  Phone: [phone]    PCP:  Erskine Emery  [address]  [place] [address]  [phone]    PATIENT ID: Bobby Duarte is a 80 y.o. male    ENCOUNTER DATE: 03/14/2021    CHIEF COMPLAINT(S):    HISTORY OF THE PRESENT ILLNESS:    Bobby Duarte is a pleasant 80 y.o. male who is being seen in the Cardiology clinic today for the above chief complaint(s). His cardiac history is remarkable for HTN, HLD.  BP controlled.    He was evaluated July 2020 in the hospital for not feeling himself and spacing out.  He had some PVCs on the monitor therefore he was kept for observation.  He underwent an exercise stress echocardiogram that was unremarkable.  He underwent a 24-hour Holter monitor that showed PVCs.  He has been doing well but mid-September 2020 he had episodes of vertigo with nausea he went to the hospital where he had an EKG that showed his PVCs his blood pressure was controlled and his heart rate was controlled felt to be more like vertigo so he was started on meclizine his symptoms revolve resolved he did not like the meclizine afterwards and was stopped.      Has been doing very well no chest pain tightness or shortness of breath no syncope no vertigo.blood pressure has been under good control.  His wife says he does so much outside without cardiac symptoms.  Has stable lower extremity swelling that goes up and down.  No smoking         Medical History:   Diagnosis Date   ? Dyslipidemia    ? Hypertension    ? PVC's (premature ventricular contractions)      No past surgical history on file.  Family History   Problem Relation Age of Onset   ? Cancer Mother    ? Hypertension Father      Social History     Socioeconomic History   ? Marital status: Married   Occupational History   ? Occupation: farmer   Tobacco Use   ? Smoking status: Never Smoker   ? Smokeless tobacco: Never Used   Substance and Sexual Activity   ? Alcohol use: Yes     Comment: occ   ? Drug use: Never        Allergies:  No Known Allergies      Medications:    Current Outpatient Medications:   ?  allopurinoL (ZYLOPRIM) 100 mg tablet, Take 100 mg by mouth daily., Disp: , Rfl:   ?  fexofenadine (ALLEGRA) 180 mg tablet, Take 180 mg by mouth as Needed., Disp: , Rfl:   ?  hydroCHLOROthiazide (HYDRODIURIL) 12.5 mg tablet, Take 12.5 mg by mouth every morning., Disp: , Rfl:   ?  lisinopriL (ZESTRIL) 40 mg tablet, Take 1 tablet by mouth at bedtime daily., Disp: , Rfl:   ?  metoprolol succinate XL (TOPROL XL) 25 mg extended release tablet, Take 1 tablet by mouth once daily, Disp: 90 tablet, Rfl: 3  ?  NIFEdipine XL (PROCARDIA-XL) 60 mg tablet, Take 1 tablet by mouth at bedtime daily., Disp: , Rfl:   ?  potassium chloride SR (KLOR-CON M20) 20 mEq tablet, Take one tablet by mouth daily. Take  with a meal and a full glass of water., Disp: 90 tablet, Rfl: 3  ?  rosuvastatin (CRESTOR) 10 mg tablet, Take one tablet by mouth daily., Disp: 90 tablet, Rfl: 3    Review of Symptoms:  Constitution: Negative.   HENT: Negative.    Eyes: Negative.    Cardiovascular: Negative.    Respiratory: Negative.    Endocrine: Negative.    Hematologic/Lymphatic: Negative.    Skin: Negative.    Gastrointestinal: Negative.    Genitourinary: Negative.    Neurological: Negative.    Psychiatric/Behavioral: Negative.    Allergic/Immunologic: Negative.      Physical Exam:  BP 128/84 (BP Source: Arm, Right Upper, Patient Position: Sitting)  - Pulse 71  - Ht 180.3 cm (5' 11)  - Wt 99.4 kg (219 lb 1.6 oz)  - BMI 30.56 kg/m?     General Appearance:    Alert, cooperative, no distress, appears stated age  Head:    Normocephalic, without obvious abnormality, atraumatic  Eyes:    PERRL, conjunctiva/corneas clear  Throat:   Lips, mucosa, and tongue normal  Neck:   no carotid bruit or JVD  Back:     Symmetric, no curvature  Lungs:     Clear to auscultation bilaterally, respirations unlabored  Chest Wall:    No tenderness or deformity   Heart:    Regular rate and rhythm, S1 and S2 normal, no murmur, rub or gallop  Abdomen:     Soft, non-tender, bowel sounds active all four quadrants, no masses, no organomegaly  Extremities:  Mild bilateral edema stable per patient  Pulses:   2+ and symmetric all extremities  Skin:   Skin color, texture, turgor normal, no rashes or lesions  Neurologic:   Non focal        Lab/Radiology/Other Diagnostic Tests:  `Hemogram   Lab Results   Component Value Date/Time    WBC 9.2 01/22/2019 12:00 AM    RBC 5.58 01/22/2019 12:00 AM    HGB 16.6 01/22/2019 12:00 AM    HCT 30.9 01/22/2019 12:00 AM    MCV 91.2 01/22/2019 12:00 AM    MCH 29.8 01/22/2019 12:00 AM    MCHC 32.7 01/22/2019 12:00 AM    RDW 13 01/22/2019 12:00 AM    PLTCT 269 01/22/2019 12:00 AM        Lab Level  Lab Results   Component Value Date    CHOL 155 06/15/2020     Lab Results   Component Value Date    LDL 96 06/15/2020     No results found for: LIPOPROTA  Lab Results   Component Value Date    HDL 46 06/15/2020     No results found for: [facility]  Lab Results   Component Value Date    TRIG 63 06/15/2020       Basic Chemistry    Lab Results   Component Value Date    NA 137 06/15/2020    NA 138 05/28/2019    NA 140 11/30/2018     Lab Results   Component Value Date    K 3.5 06/15/2020    K 3.6 05/28/2019    K 3.8 11/30/2018     Lab Results   Component Value Date    CO2 24 06/15/2020    CO2 23 05/28/2019    CO2 24.0 11/30/2018     Lab Results   Component Value Date    BUN 16.0 06/15/2020    BUN 21 05/28/2019  BUN 23.0 11/30/2018     Lab Results   Component Value Date    CR 1.03 06/15/2020    CR 1.07 05/28/2019    CR 1.25 11/30/2018     Lab Results   Component Value Date    CL 101 06/15/2020    CL 103 05/28/2019    CL 107 11/30/2018         Exercise stress echocardiogram: 12/08/2018 outside hospital.  Exercised for 6 minutes.  No evidence for ischemia with exercise.  PVCs at rest and with exercise.    Holter monitor 03/02/19  ? Indication: Ventricular premature depolarization  ? Analysis time: 73 hr  ? Mean HR: 59 bpm. Maximum HR: 99 bpm. Minimum HR: 42 bpm, 6 AM possibly when asleep, asymptomatic  ? Ventricular prematurities - total: [address], 7.2% asymptomatic.  ? Supraventricular prematurities - total: 612, the longest SVT run was 11 beats.  ? No triggered events noted. Some diary entries are not represented on report due to occurring before or after readable rhythm.     CMR:  1. ?No evidence of delayed myocardial enhancement or T1/T2 mapping abnormality to suggest scar, myocarditis or infiltrating myocardial disease.     2. ?Normal size left ventricle with normal systolic function and ejection fraction estimated at 78%.       ASSESSMENT AND PLAN:  1. PVCs: 7 (2020) to 14% (2021).  02/2020 0.03%  - He had a negative stress echo and normal MRI. He was evaluated by Dr. Wallene Huh.  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 Toprol/Nifedipine.  Has not been taking potassium 20 meq daily.  Follow-up labs see if he needs potassium replacement.  -Echocardiogram with normal LV function.    2.  Dyslipidemia.  - Continue Crestor 10 mg daily.  LDL 96 increased, I had previously offered offered to increase his Crestor 20 mg daily.  She is not interested in.  I discussed with him today we will proceed with calcium scoring if significantly elevated for his age might increase his Crestor.  He will think about it.    3.  Hypertension appears controlled.   - He has been on hydrochlorothiazide 12.5mg .  - lisinopril, metoprolol, nifedipine (with mild lower extremity swelling)    4.  Stable lower extremity swelling: Cardiac MRI with normal heart function.  Normal IVC size.  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  Continued to monitor.    Lucianne Muss, MD  8:35 AM, 03/14/2021

## 2021-10-13 IMAGING — US VDUPLEBI
1 series · 14 of 25 positions shown · non-contrast
Comparison: none

[Series 1: us venous duplex low ext bi · portal-venous · 14 of 60 slices shown]
[im 1/60]
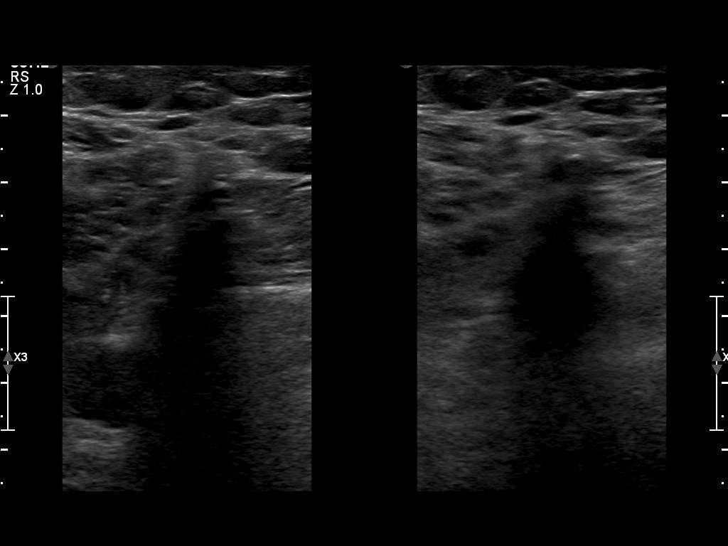
[im 5/60]
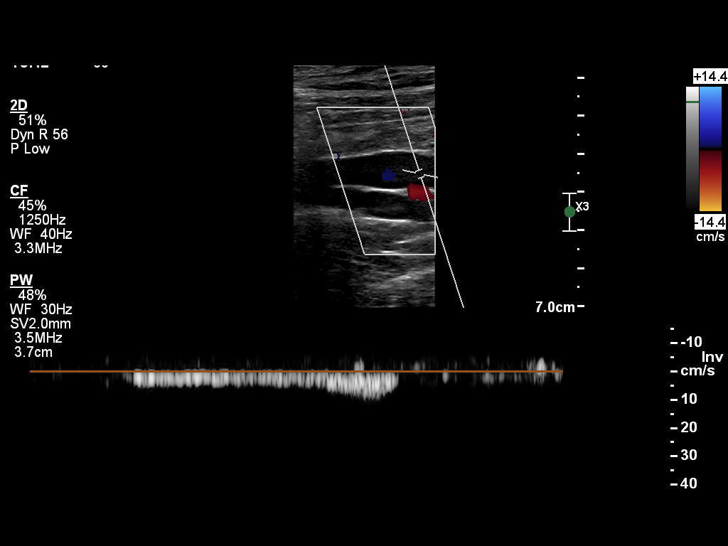
[im 10/60]
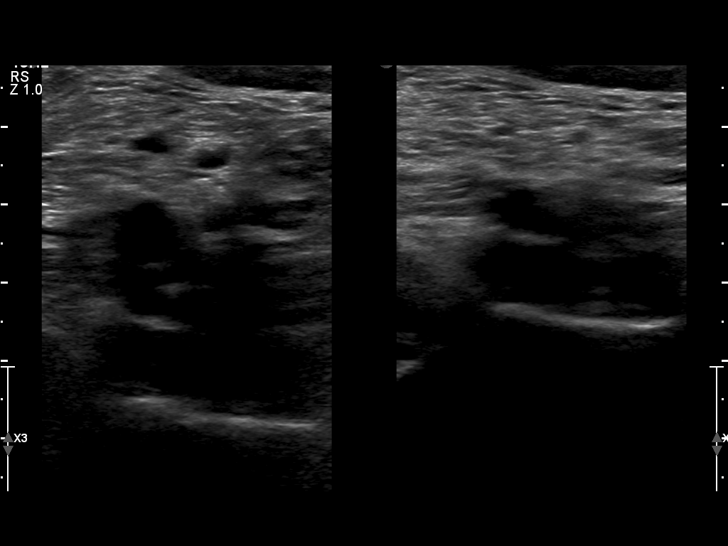
[im 15/60]
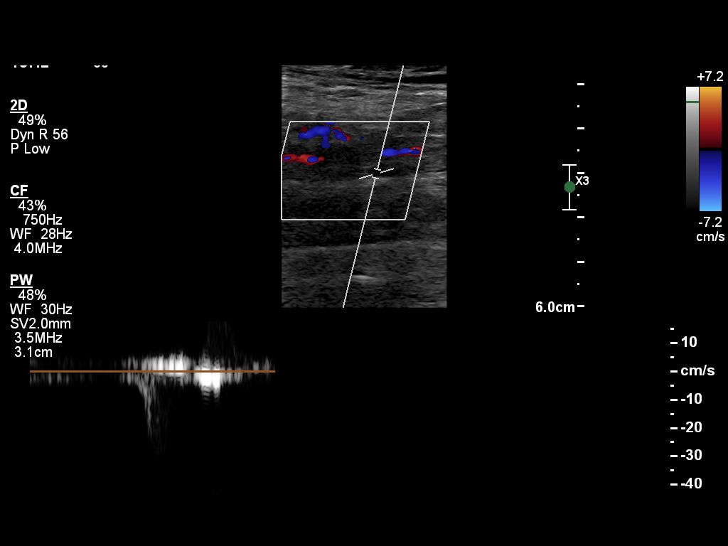
[im 20/60]
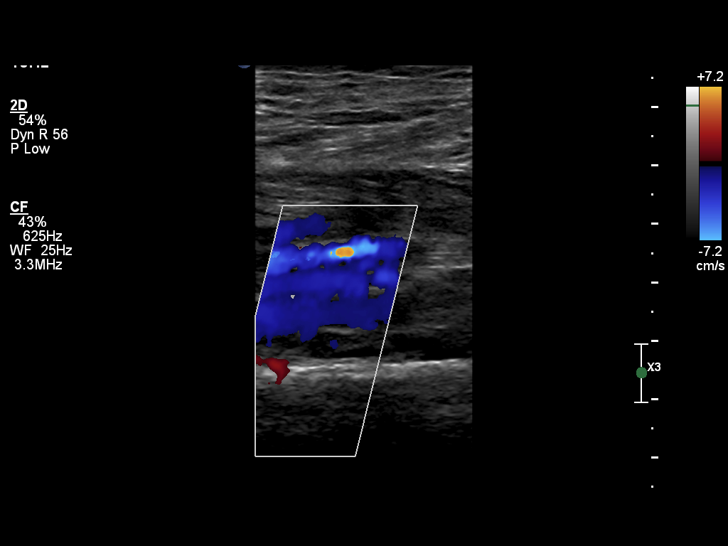
[im 23/60]
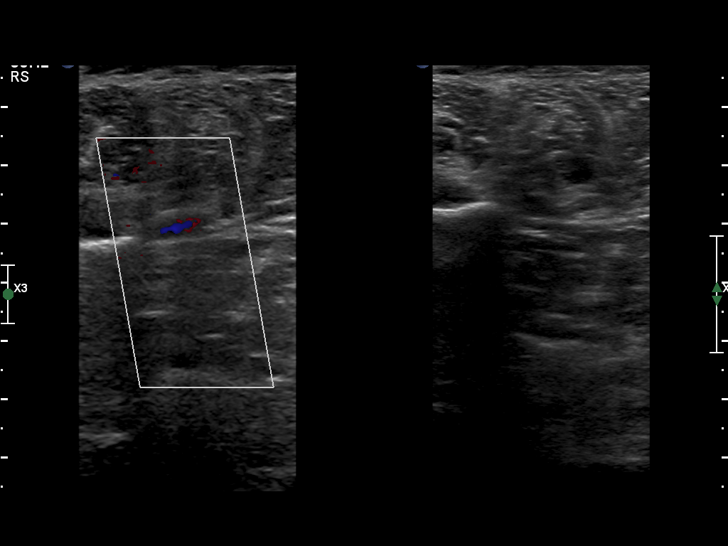
[im 28/60]
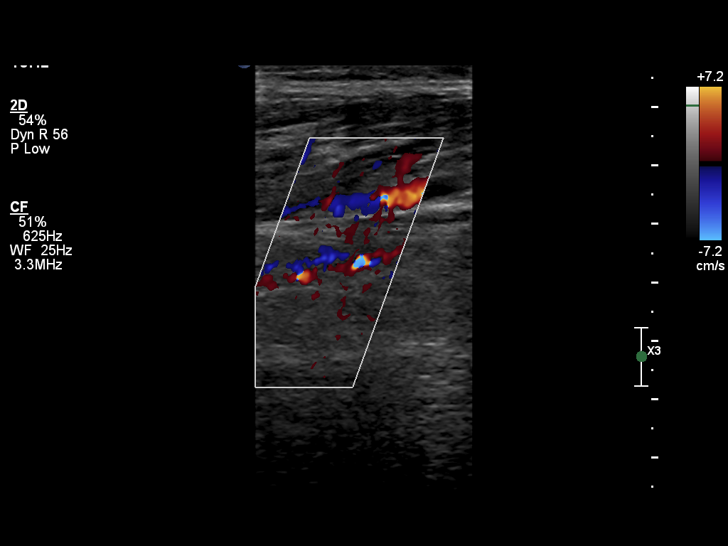
[im 32/60]
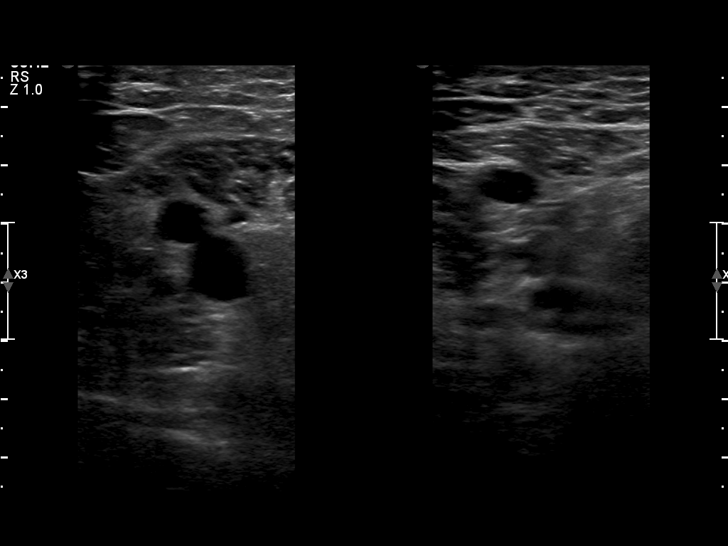
[im 37/60]
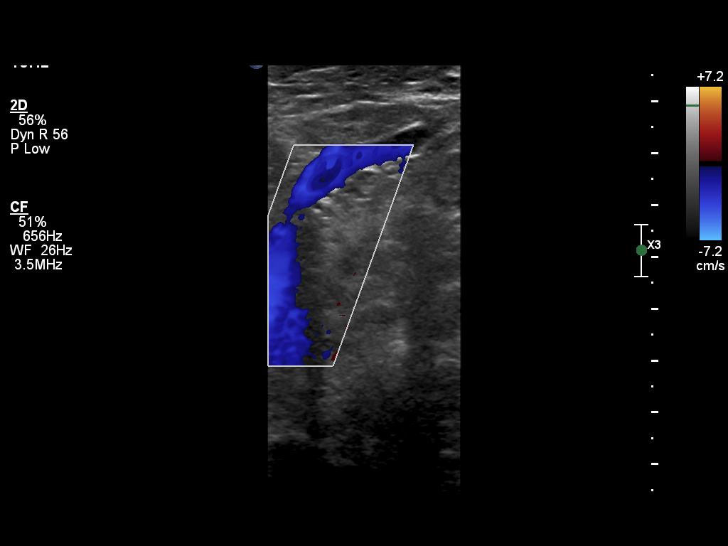
[im 40/60]
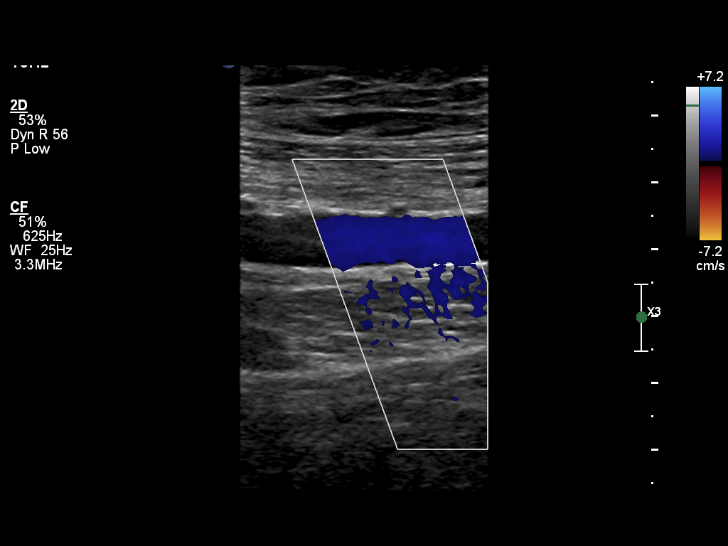
[im 45/60]
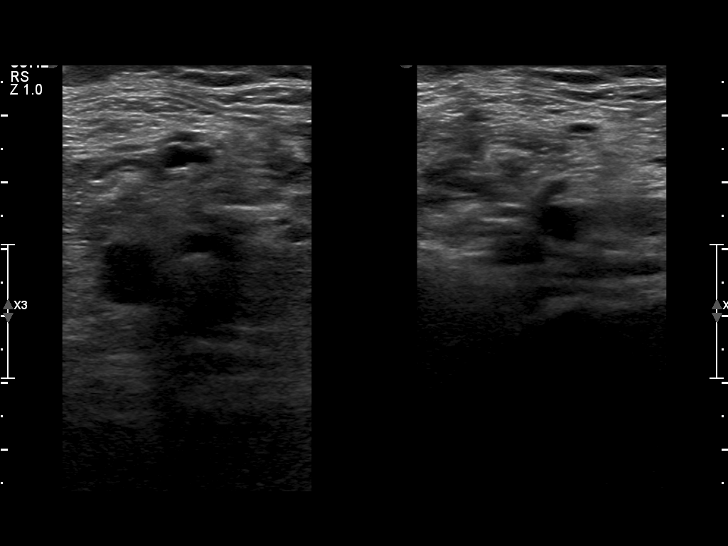
[im 50/60]
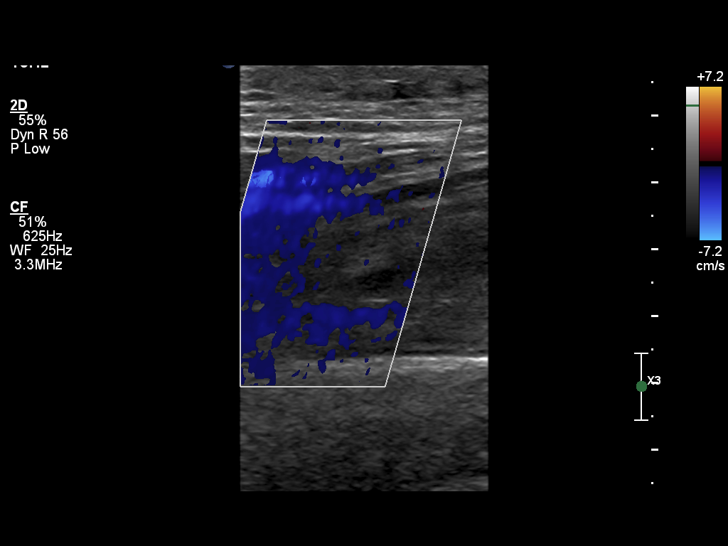
[im 55/60]
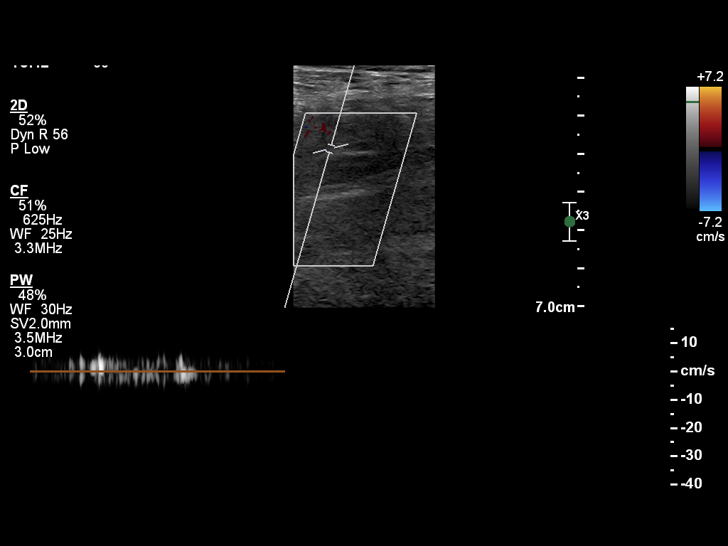
[im 60/60]
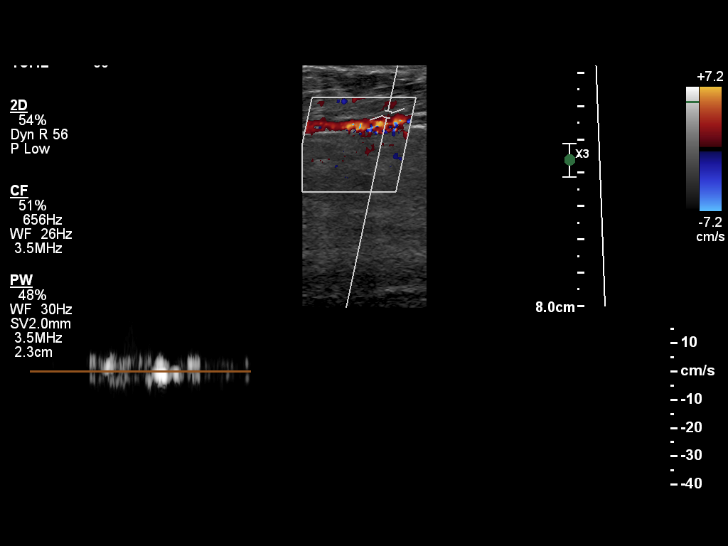

[14 of 25 positions shown; findings below may reference images not displayed]

DIAGNOSTIC STUDIES

EXAM

Bilateral lower extremity Doppler ultrasound.

INDICATION

TECHNIQUE

High-resolution duplex imaging lower extremities bilaterally was obtained.

COMPARISONS

None available

FINDINGS

There is normal flow, augmentation, and compressibility throughout both lower extremities. There is
no evidence for deep venous thrombosis.

IMPRESSION

No evidence for deep venous thrombosis throughout either lower extremity.

Tech Notes:

## 2022-02-23 IMAGING — CR [ID]
2 series · 2 of 2 positions shown · non-contrast
Comparison: none

[knee ap]
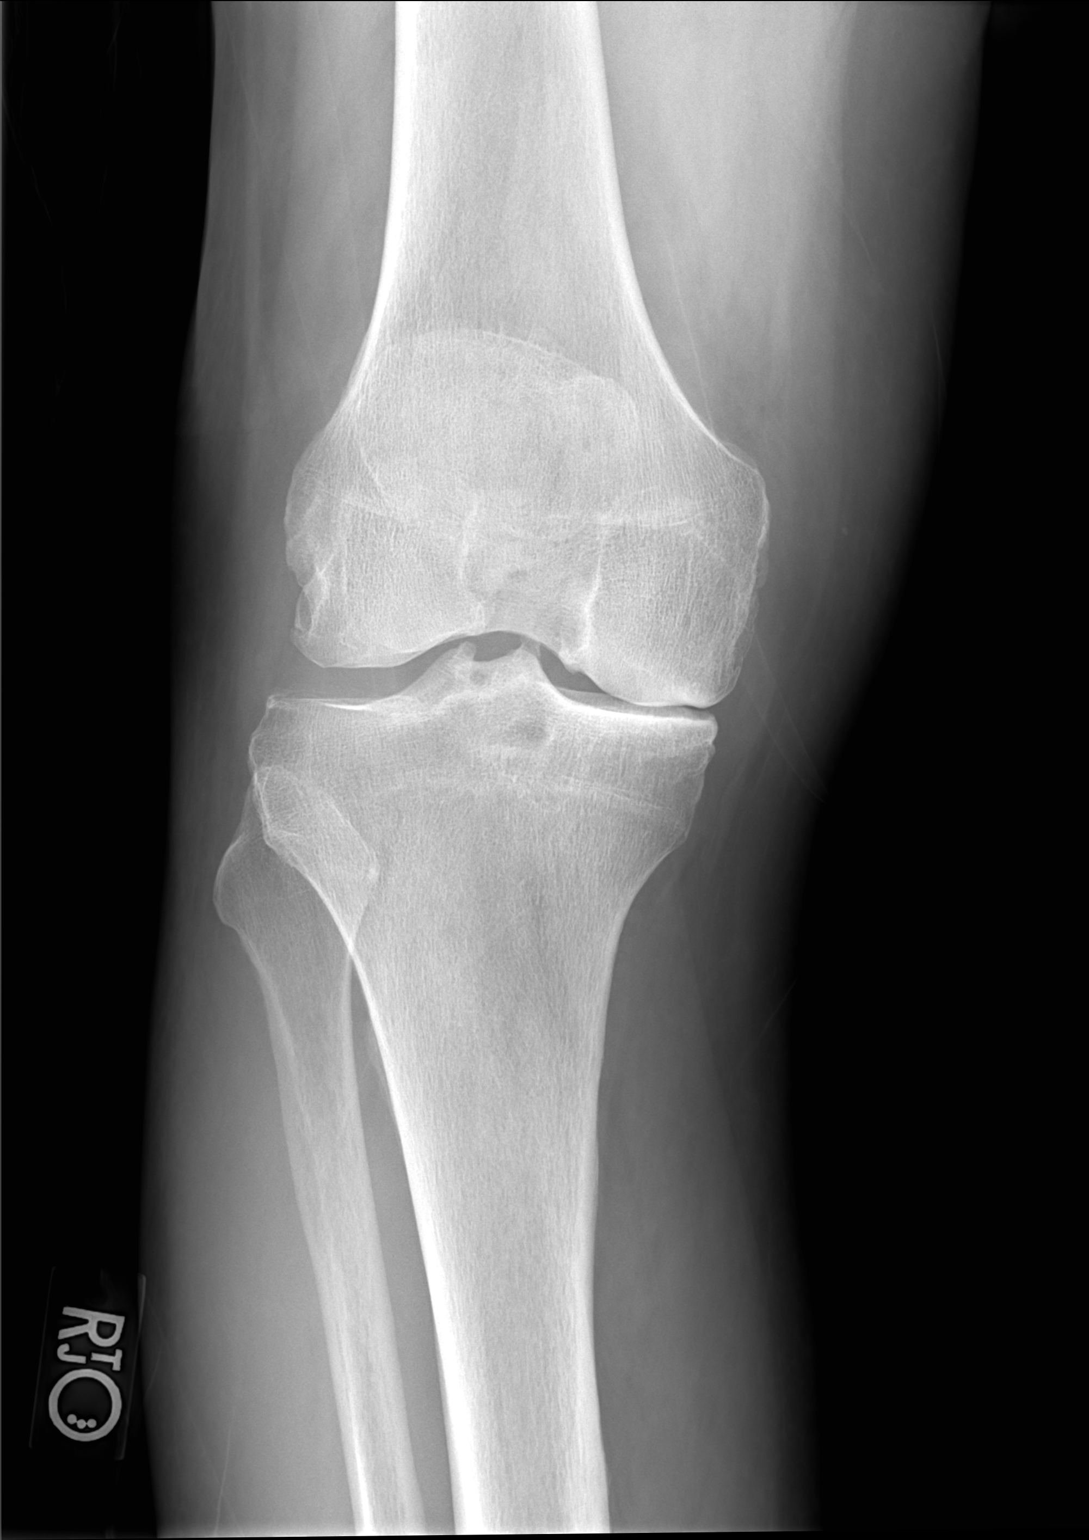

[knee lat]
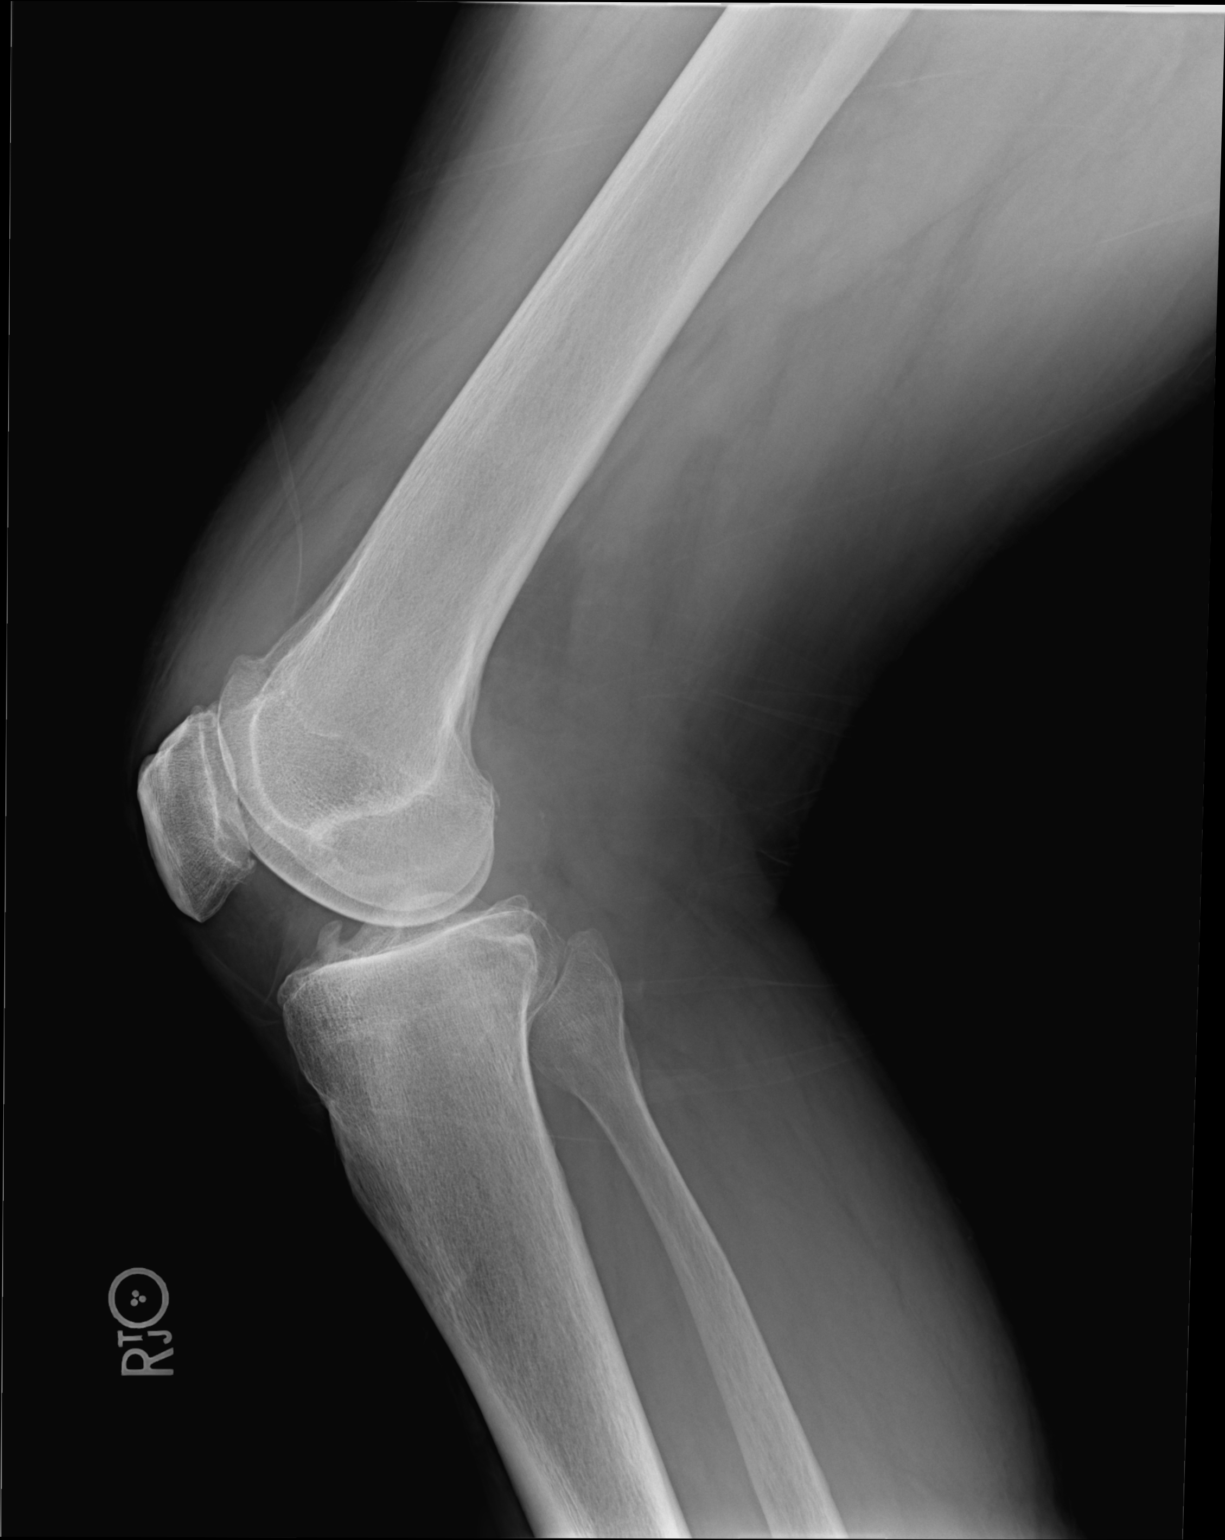

[2 of 2 positions shown; findings below may reference images not displayed]

DIAGNOSTIC STUDIES

EXAM

XR knee RT 3V

INDICATION

knee pain
Right knee pain, no known injury.

TECHNIQUE

AP lateral patellar views right knee

COMPARISONS

January 21, 2019

FINDINGS

There is moderate to marked medial compartment narrowing with loss of articular cartilage of the
medial compartment. There is spurring off the articular facets of patella with small knee effusion.
No fractures are seen.

IMPRESSION

Moderate to marked medial compartment narrowing with spurring of the articular facets of the
patella. Small knee effusion is noted.

Tech Notes:

Right knee pain, no known injury.

## 2022-09-11 ENCOUNTER — Encounter: Admit: 2022-09-11 | Discharge: 2022-09-11 | Payer: MEDICARE

## 2022-09-18 ENCOUNTER — Encounter: Admit: 2022-09-18 | Discharge: 2022-09-18 | Payer: MEDICARE

## 2022-09-18 DIAGNOSIS — I493 Ventricular premature depolarization: Secondary | ICD-10-CM

## 2022-09-18 DIAGNOSIS — I10 Essential (primary) hypertension: Secondary | ICD-10-CM

## 2022-09-18 DIAGNOSIS — H8113 Benign paroxysmal vertigo, bilateral: Secondary | ICD-10-CM

## 2022-09-18 DIAGNOSIS — E785 Hyperlipidemia, unspecified: Secondary | ICD-10-CM

## 2022-09-18 DIAGNOSIS — I358 Other nonrheumatic aortic valve disorders: Secondary | ICD-10-CM

## 2022-09-18 NOTE — Assessment & Plan Note
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  This is something that we we will need to monitor for the development of any significant aortic stenosis.  He did not have a gradient on the echocardiogram a year and a half ago.  I will probably plan to see him back in just about a year but I do not think we need another echocardiogram at this point.

## 2022-09-18 NOTE — Assessment & Plan Note
He's checked BP at home--generally 120-130/70-80.

## 2022-09-18 NOTE — Assessment & Plan Note
It sounds like he still has some occasional problems with positional vertigo.  I mentioned the fact that [place] has a vestibular therapy clinic if he really starts to have more trouble with this.

## 2022-09-18 NOTE — Progress Notes
Date of Service: 09/18/2022    Bobby Duarte is a 82 y.o. male.       HPI     Bobby Duarte was in the [place] clinic today with his wife, Bobby Duarte.  I used to see Bobby Duarte is a patient years ago.    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  He had gone into the emergency department in 2021 for some vertigo symptoms and was found to have asymptomatic PVCs on telemetry.  This prompted a pretty big workup including an echocardiogram, Holter monitor, and cardiac MRI.  He had a little bit of aortic valve sclerosis on the echocardiogram but otherwise no significant problems with structural heart disease.    The PVCs never did cause him any palpitation symptoms and he remains free of any cardiac symptoms.  He denies any chest discomfort or breathlessness.  He has had no trouble with palpitations, lightheadedness, or syncope.    He does still have a little bit of trouble with positional vertigo, especially when he first lays down in bed.           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 12/01/2018 - Exercise Stress Echo - 1. No exercise induced chest pain  2. no ECG evidence of ischemia  3. No echocardiographic evidence of ischemia  4. Occasional PVCs with exercise  5. Low risk scan   b. 03/02/2019 - Holter Monitor - 1. 73 hours --mean HR 59 Bpm / Max HR 99 bpm / Minimum HR 42 bpm  - 7.2% ventricular prematurities asympto,atoc / SVT prematurities 612 the longest  SVT run 11 beats / no triggered events  --> increased Toprol to 25 mg QD   C. 02/05/2020 - Cardiac MRI:  No evidence of delayed myocardial enhancement or T1/T2 mapping abnormality to suggest scar, myocarditis or infiltrating myocardial disease.   Normal size left ventricle with normal systolic function and ejection fraction estimated at 78%.       Hypertension     Hyperlipidemia          Review of Systems   Constitutional: Negative.   HENT: Negative.     Eyes: Negative.    Cardiovascular:  Positive for leg swelling.   Respiratory: Negative.     Endocrine: Negative.    Hematologic/Lymphatic: Negative.    Skin: Negative.    Musculoskeletal:  Positive for joint pain.   Gastrointestinal: Negative.    Genitourinary: Negative.    Neurological:  Positive for vertigo.   Psychiatric/Behavioral: Negative.     Allergic/Immunologic: Negative.        Physical Exam    Physical Exam   General Appearance: no distress   Skin: warm, no ulcers or xanthomas   Digits and Nails: no cyanosis or clubbing   Eyes: conjunctivae and lids normal, pupils are equal and round   Teeth/Gums/Palate: dentition unremarkable, no  lesions   Lips & Oral Mucosa: no pallor or cyanosis   Neck Veins: normal JVP , neck veins are not distended   Thyroid: no nodules, masses, tenderness or enlargement   Chest Inspection: chest is normal in appearance   Respiratory Effort: breathing comfortably, no respiratory distress   Auscultation/Percussion: lungs clear to auscultation, no rales or rhonchi, no wheezing   PMI: PMI not enlarged or displaced   Cardiac Rhythm: regular rhythm and normal rate   Cardiac Auscultation: S1, S2 normal, no rub, no gallop   Murmurs: grade 1 systolic murmur   Peripheral Circulation: normal peripheral circulation   Carotid Arteries: normal carotid upstroke bilaterally, no bruits   Radial Arteries: normal symmetric radial pulses   Abdominal Aorta: no abdominal aortic bruit   Pedal Pulses: normal symmetric pedal pulses   Lower Extremity Edema: no lower extremity edema   Abdominal Exam: soft, non-tender, no masses, bowel sounds normal   Liver & Spleen: no organomegaly   Gait & Station: walks without assistance   Muscle Strength: normal muscle tone   Orientation: oriented to time, place and person   Affect & Mood: appropriate and sustained affect   Language and Memory: patient responsive and seems to comprehend information   Neurologic Exam: neurological assessment grossly intact   Other: moves all extremities      Cardiovascular Studies    EKG:  SR, rate 67.  Normal tracing.    Cardiovascular Health Factors  Vitals BP Readings from Last 3 Encounters:   09/18/22 133/80   03/14/21 (!) 142/84   03/14/21 128/84     Wt Readings from Last 3 Encounters:   09/18/22 102.5 kg (226 lb)   03/14/21 99.4 kg (219 lb 1.6 oz)   03/14/21 99.4 kg (219 lb 1.6 oz)     BMI Readings from Last 3 Encounters:   09/18/22 30.65 kg/m?   03/14/21 30.56 kg/m?   03/14/21 30.56 kg/m?      Smoking Social History     Tobacco Use   Smoking Status Never   Smokeless Tobacco Never      Lipid Profile Cholesterol   Date Value Ref Range Status   09/11/2021 162  Final     HDL   Date Value Ref Range Status   09/11/2021 49  Final     LDL   Date Value Ref Range Status   09/11/2021 95  Final     Triglycerides   Date Value Ref Range Status   09/11/2021 91  Final      Blood Sugar No results found for: HGBA1C  Glucose   Date Value Ref Range Status   09/11/2021 111 (H)  Final   06/15/2020 89     05/28/2019 96  Final          Problems Addressed Today  Encounter Diagnoses   Name Primary?    PVC's (premature ventricular contractions) Yes    Hyperlipidemia, unspecified hyperlipidemia type     Primary hypertension     Aortic valve sclerosis     Benign paroxysmal positional vertigo due to bilateral vestibular disorder        Assessment and Plan       PVC's (premature ventricular contractions)  Big work-up in 2020-21 included stress echo, Holter monitor, and cardiac MR.  No evidence of structural heart disease.  The PVCs he had were picked up during an ED visit for vertigo.  They never were symptomatic.    Hyperlipidemia  Lab Results   Component Value Date  CHOL 162 09/11/2021    TRIG 91 09/11/2021    HDL 49 09/11/2021    LDL 95 09/11/2021    VLDL 18 09/11/2021    CHOLHDLC 3 09/11/2021      I think that LDL of less than 100 is a reasonable goal.  He actually is not known to have any significant atherosclerotic disease.    Hypertension  He's checked BP at home--generally 120-130/70-80.    Aortic valve sclerosis  His echocardiogram in 2022 showed some aortic valve sclerosis and he has a soft systolic murmur.  This is something that we we will need to monitor for the development of any significant aortic stenosis.  He did not have a gradient on the echocardiogram a year and a half ago.  I will probably plan to see him back in just about a year but I do not think we need another echocardiogram at this point.    Benign paroxysmal positional vertigo due to bilateral vestibular disorder  It sounds like he still has some occasional problems with positional vertigo.  I mentioned the fact that [place] has a vestibular therapy clinic if he really starts to have more trouble with this.    Current Medications (including today's revisions)   allopurinoL (ZYLOPRIM) 100 mg tablet Take one tablet by mouth daily.    hydroCHLOROthiazide (HYDRODIURIL) 12.5 mg tablet Take one tablet by mouth every morning.    lisinopriL (ZESTRIL) 40 mg tablet Take one tablet by mouth at bedtime daily.    metoprolol tartrate 25 mg tablet Take one tablet by mouth at bedtime daily.    NIFEdipine XL (PROCARDIA-XL) 60 mg tablet Take one tablet by mouth at bedtime daily.    rosuvastatin (CRESTOR) 10 mg tablet Take one tablet by mouth daily.     Total time spent on today's office visit was 45 minutes.  This includes face-to-face in person visit with patient as well as nonface-to-face time including review of the EMR, outside records, labs, radiologic studies, echocardiogram & other cardiovascular studies, formation of treatment plan, after visit summary, future disposition, and lastly on documentation.

## 2022-11-26 ENCOUNTER — Encounter: Admit: 2022-11-26 | Discharge: 2022-11-26 | Payer: MEDICARE

## 2022-11-26 NOTE — Telephone Encounter
Received a request for cardiac clearance from Dr. Barry Dienes for patient to undergo a right total knee replacement with Dr. Para March at [facility] on 12/11/22.     Patient is followed by Dr. Barry Dienes for Aortic valve sclerosis, hypertension, hyperlipidemia, and PVC's.    Called and spoke to patient. Patient states that he is feeling well. Denies any new cardiac symptoms of SOB, fatigue, chest pain, palpitations, increased activity intolerance, etc.     Last OV with Dr. Barry Dienes: 09/18/22  Testing: Echo 02/2021- Left Ventricle: The left ventricular size is normal. The left ventricular wall thickness is normal. Normal geometry. The left ventricular systolic function is normal. There are no segmental wall motion abnormalities. Normal left ventricular diastolic function. Normal left atrial pressure.Right Ventricle: The right ventricle is mildly dilated. The right ventricular systolic function is normal.Mild tricuspid valve regurgitation. Aortic valve sclerosis without hemodynamically significant stenosis.Normal central venous pressure. Estimated Peak Systolic PA Pressure 32 mmHg        Will review with Dr. Barry Dienes for recommendations.

## 2023-09-18 ENCOUNTER — Encounter: Admit: 2023-09-18 | Discharge: 2023-09-18 | Payer: MEDICARE

## 2023-09-18 ENCOUNTER — Ambulatory Visit: Admit: 2023-09-18 | Discharge: 2023-09-18 | Payer: MEDICARE

## 2023-09-19 ENCOUNTER — Encounter: Admit: 2023-09-19 | Discharge: 2023-09-19 | Payer: MEDICARE

## 2023-09-19 NOTE — Telephone Encounter
 Received echo from [facility] completed 09/18/23. Report indicates:        Full report sent to medical records to be scanned. Pt is scheduled to see Dr. Lander Pines on 11/05/23. There is a separate note from [facility] [employer], Dr. Vallarie Gauze, stating pt is scheduled for right total knee on 11/12/23 and cardiac clearance is needed. Routing to Dr. Lander Pines for review.

## 2023-09-20 ENCOUNTER — Encounter: Admit: 2023-09-20 | Discharge: 2023-09-20 | Payer: MEDICARE

## 2023-09-20 NOTE — Telephone Encounter
-----   Message from Baxter Limber, MD sent at 09/20/2023  8:09 AM CDT -----  [facility], can you please let [facility] know that his echo looks good?  We should probably repeat an echo in 2 years to re-assess the sclerotic aortic valve, to be sure he's not developing stenosis.    Thanks.    Cc:  Dr. Andy Bannister  ----- Message -----  From: Diann Forth, MD  Sent: 09/19/2023   9:21 AM CDT  To: Zannie Hey, MD

## 2023-09-20 NOTE — Telephone Encounter
 Left vm with echo results and notification he is cleared for knee surgery. Clearance has been sent to his surgeons office. Callback number provided for further questions or concerns.

## 2023-11-04 ENCOUNTER — Encounter: Admit: 2023-11-04 | Discharge: 2023-11-04 | Payer: MEDICARE

## 2023-11-04 NOTE — Assessment & Plan Note
 Lisinopril 40/d, HCTZ 12.5/d, metoprolol tartrate 25/d, nifedipine XL 60/d

## 2023-11-04 NOTE — Assessment & Plan Note
 Lab Results   Component Value Date    CHOL 173 05/24/2023    TRIG 73 05/24/2023    HDL 47 05/24/2023    LDL 112 (H) 05/24/2023    VLDL 15 05/24/2023    CHOLHDLC 4 05/24/2023      Rosuvastatin 10 mg/day.  If we consider his aortic sclerosis an atherosclerotic lesion his LDL really should be < 70.

## 2023-11-05 ENCOUNTER — Encounter: Admit: 2023-11-05 | Discharge: 2023-11-05 | Payer: MEDICARE

## 2023-11-05 ENCOUNTER — Ambulatory Visit: Admit: 2023-11-05 | Discharge: 2023-11-05 | Payer: MEDICARE

## 2023-11-05 DIAGNOSIS — I358 Other nonrheumatic aortic valve disorders: Secondary | ICD-10-CM

## 2023-11-05 DIAGNOSIS — E785 Hyperlipidemia, unspecified: Secondary | ICD-10-CM

## 2023-11-05 DIAGNOSIS — I493 Ventricular premature depolarization: Secondary | ICD-10-CM

## 2023-11-05 DIAGNOSIS — I10 Essential (primary) hypertension: Secondary | ICD-10-CM

## 2023-11-05 NOTE — Progress Notes
 Date of Service: 11/05/2023    Bobby Duarte is a 83 y.o. male.       HPI     Bobby Duarte was in the [place] clinic today for follow-up regarding asymptomatic PVCs.  I used to see his wife, Bobby Duarte, as a patient years ago.     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  He had gone into the emergency department in 2021 for some vertigo symptoms and was found to have asymptomatic PVCs on telemetry.  This prompted a pretty big workup including an echocardiogram, Holter monitor, and cardiac MRI.  He had a little bit of aortic valve sclerosis on the echocardiogram but otherwise no significant problems with structural heart disease.     The PVCs never did cause him any palpitation symptoms and he remains free of any cardiac symptoms.  He denies any chest discomfort or breathlessness.  He has had no trouble with palpitations, lightheadedness, or syncope.    He had an echocardiogram in late April, 2025, that shows EF 65% and no significant valve abnormalities.              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 12/01/2018 - Exercise Stress Echo - 1. No exercise induced chest pain  2. no ECG evidence of ischemia  3. No echocardiographic evidence of ischemia  4. Occasional PVCs with exercise  5. Low risk scan   b. 03/02/2019 - Holter Monitor - 1. 73 hours --mean HR 59 Bpm / Max HR 99 bpm / Minimum HR 42 bpm  - 7.2% ventricular prematurities asympto,atoc / SVT prematurities 612 the longest  SVT run 11 beats / no triggered events  --> increased Toprol to 25 mg QD   C.  02/05/2020 - Cardiac MRI:  No evidence of delayed myocardial enhancement or T1/T2 mapping abnormality to suggest scar, myocarditis or infiltrating myocardial disease.   Normal size left ventricle with normal systolic function and ejection fraction estimated at 78%.       Hypertension     Hyperlipidemia          Review of Systems   Constitutional: Negative.   HENT: Negative.     Eyes: Negative.    Cardiovascular: Negative.    Respiratory: Negative.     Endocrine: Negative.    Hematologic/Lymphatic: Negative.    Skin: Negative.    Musculoskeletal:  Positive for joint pain.   Gastrointestinal: Negative.    Genitourinary: Negative.    Neurological: Negative.    Psychiatric/Behavioral: Negative.     Allergic/Immunologic: Negative.        Physical Exam    Physical Exam   General Appearance: no distress   Skin: warm, no ulcers or xanthomas   Digits and Nails: no cyanosis or clubbing   Eyes: conjunctivae and lids normal, pupils are equal and round  Teeth/Gums/Palate: dentition unremarkable, no lesions   Lips & Oral Mucosa: no pallor or cyanosis   Neck Veins: normal JVP , neck veins are not distended   Thyroid: no nodules, masses, tenderness or enlargement   Chest Inspection: chest is normal in appearance   Respiratory Effort: breathing comfortably, no respiratory distress   Auscultation/Percussion: lungs clear to auscultation, no rales or rhonchi, no wheezing   PMI: PMI not enlarged or displaced   Cardiac Rhythm: regular rhythm and normal rate   Cardiac Auscultation: S1, S2 normal, no rub, no gallop   Murmurs: no murmur   Peripheral Circulation: normal peripheral circulation   Carotid Arteries: normal carotid upstroke bilaterally, no bruits   Radial Arteries: normal symmetric radial pulses   Abdominal Aorta: no abdominal aortic bruit   Pedal Pulses: normal symmetric pedal pulses   Lower Extremity Edema: no lower extremity edema   Abdominal Exam: soft, non-tender, no masses, bowel sounds normal   Liver & Spleen: no organomegaly   Gait & Station: walks without assistance   Muscle Strength: normal muscle tone   Orientation: oriented to time, place and person   Affect & Mood: appropriate and sustained affect   Language and Memory: patient responsive and seems to comprehend information   Neurologic Exam: neurological assessment grossly intact   Other: moves all extremities      Cardiovascular Health Factors  Vitals BP Readings from Last 3 Encounters:   11/05/23 132/77   09/18/23 135/79   09/18/22 133/80     Wt Readings from Last 3 Encounters:   11/05/23 100.2 kg (220 lb 12.8 oz)   09/18/23 101 kg (222 lb 10.6 oz)   09/18/22 102.5 kg (226 lb)     BMI Readings from Last 3 Encounters:   11/05/23 31.68 kg/m?   09/18/23 31.95 kg/m?   09/18/22 30.65 kg/m?      Smoking Social History     Tobacco Use   Smoking Status Never   Smokeless Tobacco Never      Lipid Profile Cholesterol   Date Value Ref Range Status   05/24/2023 173  Final     HDL   Date Value Ref Range Status   05/24/2023 47  Final     LDL   Date Value Ref Range Status   05/24/2023 112 (H) <100 Final     Triglycerides   Date Value Ref Range Status   05/24/2023 73  Final      Blood Sugar No results found for: HGBA1C  Glucose   Date Value Ref Range Status   10/17/2023 125 (H) 70 - 105 Final   09/11/2021 111 (H)  Final   06/15/2020 89            Problems Addressed Today  Encounter Diagnoses   Name Primary?    Aortic valve sclerosis Yes    PVC's (premature ventricular contractions)     Primary hypertension     Hyperlipidemia, unspecified hyperlipidemia type        Assessment and Plan       Aortic valve sclerosis  Last echo was 09/18/23, continues to show sclerosis, but no aortic valve gradient.    PVC's (premature ventricular contractions)  Big work-up in 2020-21 included stress echo, Holter monitor, and cardiac MR. No evidence of structural heart disease. The PVCs he had were picked up during an ED visit for vertigo. They never were symptomatic. Echo 09/18/23:  EF 65%.  Sclerotic AV.    Hypertension  Lisinopril 40/d, HCTZ 12.5/d, metoprolol tartrate  25/d, nifedipine XL 60/d    Hyperlipidemia  Lab Results   Component Value Date    CHOL 173 05/24/2023    TRIG 73 05/24/2023    HDL 47 05/24/2023    LDL 112 (H) 05/24/2023    VLDL 15 05/24/2023    CHOLHDLC 4 05/24/2023      Rosuvastatin 10 mg/day.  If we consider his aortic sclerosis an atherosclerotic lesion his LDL really should be < 70.    Current Medications (including today's revisions)   allopurinoL (ZYLOPRIM) 100 mg tablet Take one tablet by mouth daily.    hydroCHLOROthiazide (HYDRODIURIL) 12.5 mg tablet Take one tablet by mouth every morning.    lisinopriL (ZESTRIL) 40 mg tablet Take one tablet by mouth at bedtime daily.    metoprolol tartrate 25 mg tablet Take one tablet by mouth at bedtime daily.    NIFEdipine XL (PROCARDIA-XL) 60 mg tablet Take one tablet by mouth at bedtime daily.    rosuvastatin (CRESTOR) 10 mg tablet Take one tablet by mouth daily.     Total time spent on today's office visit was 30 minutes.  This includes face-to-face in person visit with patient as well as nonface-to-face time including review of the EMR, outside records, labs, radiologic studies, echocardiogram & other cardiovascular studies, formation of treatment plan, after visit summary, future disposition, and lastly on documentation.
